# Patient Record
Sex: Male | Born: 1970 | Race: White | Hispanic: No | Smoking: Current every day smoker
Health system: Southern US, Community
[De-identification: ages and names within clinical notes are randomized; demographics above are authoritative.]

## PROBLEM LIST (undated history)

## (undated) DIAGNOSIS — I10 Essential (primary) hypertension: Secondary | ICD-10-CM

## (undated) HISTORY — PX: HERNIA REPAIR: SHX51

## (undated) HISTORY — PX: OTHER SURGICAL HISTORY: SHX169

---
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
## 1997-08-11 ENCOUNTER — Ambulatory Visit (HOSPITAL_COMMUNITY): Admission: RE | Admit: 1997-08-11 | Discharge: 1997-08-11 | Payer: Self-pay | Admitting: Physician Assistant

## 1998-05-01 ENCOUNTER — Emergency Department (HOSPITAL_COMMUNITY): Admission: EM | Admit: 1998-05-01 | Discharge: 1998-05-01 | Payer: Self-pay | Admitting: Emergency Medicine

## 1998-05-01 ENCOUNTER — Encounter: Payer: Self-pay | Admitting: Emergency Medicine

## 2002-04-20 HISTORY — PX: ANTERIOR CERVICAL DISCECTOMY: SHX1160

## 2004-02-26 ENCOUNTER — Ambulatory Visit (HOSPITAL_COMMUNITY): Admission: RE | Admit: 2004-02-26 | Discharge: 2004-02-27 | Payer: Self-pay | Admitting: Neurological Surgery

## 2009-07-25 ENCOUNTER — Emergency Department (HOSPITAL_COMMUNITY): Admission: EM | Admit: 2009-07-25 | Discharge: 2009-07-25 | Payer: Self-pay | Admitting: Emergency Medicine

## 2009-07-31 ENCOUNTER — Encounter
Admission: RE | Admit: 2009-07-31 | Discharge: 2009-07-31 | Payer: Self-pay | Source: Home / Self Care | Admitting: Surgery

## 2010-05-05 LAB — SURGICAL PCR SCREEN
MRSA, PCR: NEGATIVE
Staphylococcus aureus: POSITIVE — AB

## 2010-05-05 LAB — CBC
HCT: 41.5 % (ref 39.0–52.0)
Hemoglobin: 14.9 g/dL (ref 13.0–17.0)
MCH: 33.4 pg (ref 26.0–34.0)
MCHC: 35.9 g/dL (ref 30.0–36.0)
MCV: 93 fL (ref 78.0–100.0)
Platelets: 209 10*3/uL (ref 150–400)
RBC: 4.46 MIL/uL (ref 4.22–5.81)
RDW: 13.6 % (ref 11.5–15.5)
WBC: 8.1 10*3/uL (ref 4.0–10.5)

## 2010-05-06 ENCOUNTER — Ambulatory Visit (HOSPITAL_COMMUNITY)
Admission: RE | Admit: 2010-05-06 | Discharge: 2010-05-06 | Payer: Self-pay | Source: Home / Self Care | Attending: Surgery | Admitting: Surgery

## 2010-05-19 NOTE — Op Note (Signed)
NAMEGEROLD, Paul Galloway               ACCOUNT NO.:  [id number]  MEDICAL RECORD NO.:  [id number]          PATIENT TYPE:  AMB  LOCATION:  SDS                          FACILITY:  [facility]  PHYSICIAN:  Paul Sportsman, MD     DATE OF BIRTH:  [birth year]  DATE OF PROCEDURE:  05/06/2010 DATE OF DISCHARGE:  05/06/2010                              OPERATIVE REPORT   PRIMARY CARE PHYSICIAN:  Paul Galloway. Paul Miyamoto, MD  UROLOGIST:  Paul Millard. Dahlstedt, MD  OPERATING SURGEON:  Paul Sportsman, MD  ASSISTANT:  RN.  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  POSTOPERATIVE DIAGNOSES: 1. Supraumbilical hernia. 2. Umbilical hernia. 3. Diastasis recti.  PROCEDURE PERFORMED:  Laparoscopic ventral hernia repairs with mesh.  ANESTHESIA: 1. General anesthesia. 2. Local anesthetic and a field block around all fascial stitch sites     and port sites.  SPECIMENS:  None.  DRAINS:  None.  ESTIMATED BLOOD LOSS:  Minimal.  COMPLICATIONS:  None apparent.  INDICATIONS:  Mr. Paul Galloway is a 40 year old very physically active male who does a lot of heavy lifting and is intensive in martial arts.  He developed inguinal hernias that I had laparoscopically repaired over a year ago.  He has recovered well from that but felt a pop above his bellybutton and pain and discomfort there.  He certainly has some central abdominal discomfort from time to time.  On exam, I felt he had a definite supraumbilical ventricular hernia, but it was reducible.  He has diastasis down to his umbilicus as well.  Anatomy and embryology of abdominal formation was discussed, pathophysiology of herniation with its natural history and risks of increasing size, incarceration, strangulation were discussed.  Options were discussed.  Recommendations were made for a repair.  Open primary versus with or without mesh and laparoscopic repair was discussed. Given the fact he is a smoker and has a  lot of heavy physical activity, I recommended repair with mesh.  Given the central diastasis, I have recommended diagnostic laparoscopy to get a better lifeline and plan out repair.  Risks, benefits, and alternatives were discussed.  Questions were answered and he agreed to proceed.  OPERATIVE FINDINGS:  He had a few switched hernias from the supraumbilical region to the umbilicus in an area of diastasis. Supraumbilical site largest was around 2 x 2 cm and periumbilical region was 1 x 1.5 cm.  Total region was about 9 x 2.5 cm.  DESCRIPTION OF PROCEDURE:  Informed consent was confirmed.  The patient underwent general anesthesia without any difficulty.  He voided prior entering the operating room.  He received IV antibiotics just prior to incision.  He had sequential compression devices active during the entire case.  He was positioned supine, both arms tucked.  His abdomen was clipped, prepped, and draped in sterile fashion.  Surgical time-out confirmed our plan.  I placed a 5-mm port in the left upper quadrant using optical entry technique right after the subcostal ridge with the patient in steep reverse Trendelenburg and left side up.  Entry was clean.  I introduced carbon dioxide for insufflation.  Camera  inspection revealed no injury. I could see any obvious umbilical hernia.  I can see some dimpling within the falciform ligament.  I placed 5-0 ports in the left flank in the left lower quadrant.  I took down the falciform ligament off the anterior abdominal wall and excised it just off its exit point at the liver.  With that, I could see the supraumbilical hernia where redundant falciform ligament had been going into that area.  Following from that region down which was about between the xiphoid and the umbilicus going down from that supraumbilical ventricular hernia down to the umbilicus, he had obvious diastasis, he had a few switched hernias around his umbilicus as  well.  Given his heavy physical activity and size and smoking, I felt that he would benefit from a larger piece of mesh.  Initially anticipated a patch to hold region including the diastasis.  I therefore chose a 15 x 20 cm dual-sided Parietex/Seprafilm mesh.  I placed in the anterior abdominal wall after dilating one of the fascia sites to place the port through.  I secured the anterior abdominal wall using 16 #1 Prolene stitches using the laparoscopic suture passer.  This provided over 5 cm coverage around all defects.  Mesh laid well.  I decompressed carbon dioxide and tied all stitch down.  I re- insufflated and noted the mesh lying nice and flat, was snugged and had overly tight and no tension.  I used an observable tacker to tack the rims of the mesh and central part of the mesh.  His abdominal wall was thin enough that I felt these tacks were virtually transfascial as well. Hemostasis was good.  I evacuated carbon dioxide.  I removed all ports. The left upper quadrant port site was the one I gently dilated the fascia.  I can barely get my pinky through it, closed it with some 0 Vicryl interrupted stitches x2.  I closed the skin using 4-0 Monocryl stitch at the port sites and Steri-Strips on the small puncture sites.  The patient is being extubated and took to recovery room in stable condition.  I discussed postop care with the patient in detail in the office and just holding area as well.  I discussed with his significant other as well.     Paul Sportsman, MD     SCG/MEDQ  D:  05/06/2010  T:  05/06/2010  Job:  [id number]  cc:   Paul Galloway. Paul Galloway, M.D. Paul Millard. Galloway, M.D.  Electronically Signed by Paul Soda MD on 05/19/2010 12:29:06 PM

## 2010-07-09 LAB — POCT I-STAT, CHEM 8
BUN: 22 mg/dL (ref 6–23)
Calcium, Ion: 1.12 mmol/L (ref 1.12–1.32)
Chloride: 107 mEq/L (ref 96–112)
Creatinine, Ser: 1.1 mg/dL (ref 0.4–1.5)
Glucose, Bld: 95 mg/dL (ref 70–99)
HCT: 51 % (ref 39.0–52.0)
Hemoglobin: 17.3 g/dL — ABNORMAL HIGH (ref 13.0–17.0)
Potassium: 3.6 mEq/L (ref 3.5–5.1)
Sodium: 140 mEq/L (ref 135–145)
TCO2: 24 mmol/L (ref 0–100)

## 2010-07-09 LAB — CK: Total CK: 217 U/L (ref 7–232)

## 2010-09-05 NOTE — Op Note (Signed)
Paul Galloway, Paul Galloway               ACCOUNT NO.:  [id number]   MEDICAL RECORD NO.:  [id number]          PATIENT TYPE:  OIB   LOCATION:  2899                         FACILITY:  [facility]   PHYSICIAN:  Stefani Dama, M.D.  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   OPERATION PERFORMED:  Anterior cervical decompression, arthrodesis C5-C6  structural allograft Alphatec fixation.   SURGEON:  Stefani Dama, M.D.   ASSISTANT:  Coletta Memos, M.D.   ANESTHESIA:  General endotracheal.   INDICATIONS FOR PROCEDURE:  The patient is a 40 year old individual who has  had significant neck, shoulder and right arm pain.  He has had some  expression of weakness there.  He has evidence of a Lhermitte's phenomenon  also.  He has been advised regarding surgical decompression and  stabilization via an anterior procedure.   DESCRIPTION OF PROCEDURE:  The patient was brought to the operating room and  placed on the table in supine position.  After smooth induction of general  endotracheal anesthesia, he was placed in five pounds of halter traction.  Neck was shaved, prepped with DuraPrep and draped in a sterile fashion.  Transverse incision was created in the neck and carried down through the  platysma.  The plane between the sternocleidomastoid muscle and the strap  muscles was dissected bluntly until the prevertebral space was reached.  The  first identifiable disk space was noted to be that of C5 and C6 on a  localizing radiograph.  The longus colli muscle was then stripped back  slightly and a self-retaining retractor was placed in the wound.  The disk  space was opened with a #15 blade and then a combination of curettes and  rongeurs was used to remove a significant amount of severely degenerated  disk material.  A ventral osteophyte was taken down with the Leksell rongeur  and the dissection was carried out to either side to decompress the disk  space fully.  The posterior longitudinal ligament was reached and on the  right side the ligament was noted to have a hole and by exploring this hole,  several fragments of disk material from the epidural space were removed.  This allowed for good decompression of the common dural tube right at the  take off of the C6 nerve root on the right side.  Further exploration  yielded the posterior edge of the posterior longitudinal ligament.  This was  taken up to expose the common dural tube and a C6 nerve root.  Dissection  was carried out to the left side and a similar decompression was undertaken  here.  Hemostasis from the soft tissues was obtained with a bipolar cautery  and some small pledgets of Gelfoam soaked in Thrombin which were later  removed.  A 7 mm Tranzgraft was then shaped to the appropriate size and  dimension, filled  with the patient's own autograft from dissecting the  uncinate spurs and DBX was used to fill in the remainder.  The bone graft  was sized and fitted appropriately into the disk space an then a standard  sized 18 mm Alphatec plate was fixed with six screws in C6 and variable  angle screws measuring 14 mm in length at C5.  Hemostasis was checked in the  soft tissues and the wound was copiously irrigated with antibiotic  irrigating solution after a localizing radiograph was obtained.  The locking  apparatus was  applied.  Then the patient's wound was closed with 3-0 Vicryl in the  platysma and 3-0 Vicryl in the subcuticular tissues.  Dermabond was placed  on the skin.  The patient tolerated the procedure well and was returned to  recovery room in stable condition.       HJE/MEDQ  D:  02/26/2004  T:  02/26/2004  Job:  [id number]

## 2013-01-31 ENCOUNTER — Emergency Department (HOSPITAL_COMMUNITY)
Admission: EM | Admit: 2013-01-31 | Discharge: 2013-01-31 | Disposition: A | Discharge status: Home / Self Care | Payer: Self-pay | Attending: Emergency Medicine | Admitting: Emergency Medicine

## 2013-01-31 ENCOUNTER — Encounter (HOSPITAL_COMMUNITY): Payer: Self-pay | Admitting: Emergency Medicine

## 2013-01-31 DIAGNOSIS — L03113 Cellulitis of right upper limb: Secondary | ICD-10-CM

## 2013-01-31 DIAGNOSIS — W268XXA Contact with other sharp object(s), not elsewhere classified, initial encounter: Secondary | ICD-10-CM | POA: Insufficient documentation

## 2013-01-31 DIAGNOSIS — F17200 Nicotine dependence, unspecified, uncomplicated: Secondary | ICD-10-CM | POA: Insufficient documentation

## 2013-01-31 DIAGNOSIS — Z881 Allergy status to other antibiotic agents status: Secondary | ICD-10-CM | POA: Insufficient documentation

## 2013-01-31 DIAGNOSIS — Z888 Allergy status to other drugs, medicaments and biological substances status: Secondary | ICD-10-CM | POA: Insufficient documentation

## 2013-01-31 DIAGNOSIS — Y939 Activity, unspecified: Secondary | ICD-10-CM | POA: Insufficient documentation

## 2013-01-31 DIAGNOSIS — Y929 Unspecified place or not applicable: Secondary | ICD-10-CM | POA: Insufficient documentation

## 2013-01-31 DIAGNOSIS — Z23 Encounter for immunization: Secondary | ICD-10-CM | POA: Insufficient documentation

## 2013-01-31 DIAGNOSIS — L02519 Cutaneous abscess of unspecified hand: Secondary | ICD-10-CM | POA: Insufficient documentation

## 2013-01-31 LAB — BASIC METABOLIC PANEL
BUN: 13 mg/dL (ref 6–23)
CO2: 26 mEq/L (ref 19–32)
Calcium: 9.1 mg/dL (ref 8.4–10.5)
Chloride: 101 mEq/L (ref 96–112)
Creatinine, Ser: 0.75 mg/dL (ref 0.50–1.35)
GFR calc Af Amer: >90 mL/min (ref >90)
GFR calc non Af Amer: >90 mL/min (ref >90)
Glucose, Bld: 140 mg/dL — ABNORMAL HIGH (ref 70–99)
Potassium: 3.5 mEq/L (ref 3.5–5.1)
Sodium: 137 mEq/L (ref 135–145)

## 2013-01-31 LAB — CBC WITH DIFFERENTIAL/PLATELET
Basophils Absolute: 0 10*3/uL (ref 0.0–0.1)
Basophils Relative: 0 % (ref 0–1)
Eosinophils Absolute: 0.2 10*3/uL (ref 0.0–0.7)
Eosinophils Relative: 3 % (ref 0–5)
HCT: 43.8 % (ref 39.0–52.0)
Hemoglobin: 15.3 g/dL (ref 13.0–17.0)
Lymphocytes Relative: 18 % (ref 12–46)
Lymphs Abs: 1.4 10*3/uL (ref 0.7–4.0)
MCH: 33.6 pg (ref 26.0–34.0)
MCHC: 34.9 g/dL (ref 30.0–36.0)
MCV: 96.1 fL (ref 78.0–100.0)
Monocytes Absolute: 0.7 10*3/uL (ref 0.1–1.0)
Monocytes Relative: 10 % (ref 3–12)
Neutro Abs: 5.3 10*3/uL (ref 1.7–7.7)
Neutrophils Relative %: 69 % (ref 43–77)
Platelets: 186 10*3/uL (ref 150–400)
RBC: 4.56 MIL/uL (ref 4.22–5.81)
RDW: 13.7 % (ref 11.5–15.5)
WBC: 7.9 10*3/uL (ref 4.0–10.5)

## 2013-01-31 MED ORDER — TETANUS-DIPHTH-ACELL PERTUSSIS 5-2.5-18.5 LF-MCG/0.5 IM SUSP
0.5000 mL | Freq: Once | INTRAMUSCULAR | Status: AC
  Administered 2013-01-31: 0.5 mL via INTRAMUSCULAR
  Filled 2013-01-31: qty 0.5

## 2013-01-31 MED ORDER — CLINDAMYCIN HCL 150 MG PO CAPS: 300.0000 mg | ORAL_CAPSULE | Freq: Three times a day (TID) | ORAL | Status: DC

## 2013-01-31 MED ORDER — HYDROMORPHONE HCL PF 1 MG/ML IJ SOLN
1.0000 mg | Freq: Once | INTRAMUSCULAR | Status: AC
  Administered 2013-01-31: 1 mg via INTRAVENOUS
  Filled 2013-01-31: qty 1

## 2013-01-31 MED ORDER — SODIUM CHLORIDE 0.9 % IV BOLUS (SEPSIS)
1000.0000 mL | Freq: Once | INTRAVENOUS | Status: AC
  Administered 2013-01-31: 1000 mL via INTRAVENOUS

## 2013-01-31 MED ORDER — HYDROCODONE-ACETAMINOPHEN 5-325 MG PO TABS: 2.0000 | ORAL_TABLET | ORAL | Status: DC | PRN

## 2013-01-31 MED ORDER — CLINDAMYCIN PHOSPHATE 900 MG/50ML IV SOLN
900.0000 mg | Freq: Once | INTRAVENOUS | Status: AC
  Administered 2013-01-31: 900 mg via INTRAVENOUS
  Filled 2013-01-31: qty 50

## 2013-01-31 MED ORDER — MORPHINE SULFATE 4 MG/ML IJ SOLN
4.0000 mg | Freq: Once | INTRAMUSCULAR | Status: AC
  Administered 2013-01-31: 4 mg via INTRAVENOUS
  Filled 2013-01-31: qty 1

## 2013-01-31 MED ORDER — ONDANSETRON HCL 4 MG/2ML IJ SOLN
4.0000 mg | Freq: Once | INTRAMUSCULAR | Status: AC
  Administered 2013-01-31: 4 mg via INTRAVENOUS
  Filled 2013-01-31: qty 2

## 2013-01-31 NOTE — ED Notes (Signed)
Pt states he had a closed wound on the posterior part of his left hand, pt reports that he bumped his hand on chiscel yesterday. Pt states the wound opened and within 12 hours his hand swelled up greatly. Area is red and warm to the touch. Pt states he has full sensation below the wound. Radial pulse is 2+.

## 2013-01-31 NOTE — ED Notes (Signed)
Pt states that his hand is throbbing given pain meds and ginger ale hand is propped up for comfort

## 2013-01-31 NOTE — ED Notes (Signed)
PA at bedside.

## 2013-01-31 NOTE — ED Provider Notes (Signed)
CSN: [id number]     Arrival date & time 01/31/13  6295 History   First MD Initiated Contact with Patient 01/31/13 0630     Chief Complaint  Patient presents with  . Cellulitis  . Hand Injury   (Consider location/radiation/quality/duration/timing/severity/associated sxs/prior Treatment) HPI Comments: Patient is a 42 year old male with no significant past medical history who presents with a 2 day history of left hand pain. Symptoms started gradually and progressively worsened since the onset after he was "poked" with a serrated chisel. The pain is throbbing and severe without radiation. Patient has soaked his hand in warm salt water and cleaned the area. Palpation of the area and left hand movement makes the pain worse. No alleviating factors. No other associated symptoms.   Patient is a 42 y.o. male presenting with hand injury.  Hand Injury   History reviewed. No pertinent past medical history. Past Surgical History  Procedure Laterality Date  . Hernia repair    . Right hand surgery    . Knee sugery     No family history on file. History  Substance Use Topics  . Smoking status: Current Every Day Smoker -- 1.00 packs/day  . Smokeless tobacco: Not on file  . Alcohol Use: Yes    Review of Systems  Musculoskeletal: Positive for joint swelling.  Skin: Positive for color change and wound.  All other systems reviewed and are negative.    Allergies  Amoxicillin and Tramadol  Home Medications  No current outpatient prescriptions on file. BP 142/105  Pulse 98  Temp(Src) 99.3 F (37.4 C) (Oral)  Resp 18  SpO2 98% Physical Exam  Nursing note and vitals reviewed. Constitutional: He is oriented to person, place, and time. He appears well-developed and well-nourished. No distress.  HENT:  Head: Normocephalic and atraumatic.  Eyes: Conjunctivae are normal.  Neck: Normal range of motion.  Cardiovascular: Normal rate, regular rhythm and intact distal pulses.  Exam reveals no  gallop and no friction rub.   No murmur heard. Pulmonary/Chest: Effort normal and breath sounds normal. He has no wheezes. He has no rales. He exhibits no tenderness.  Abdominal: Soft. There is no tenderness.  Musculoskeletal: Normal range of motion.  Left hand fingers ROM limited due to pain.   Neurological: He is alert and oriented to person, place, and time. Coordination normal.  Speech is goal-oriented. Moves limbs without ataxia.   Skin: Skin is warm and dry.  Localized induration and some central fluctuance noted to dorsum of left hand. Some clear drainage from the small, central open area. The area is tender to palpation and warm with associated erythema.   Psychiatric: He has a normal mood and affect. His behavior is normal.    ED Course  Procedures (including critical care time)  INCISION AND DRAINAGE Performed by: Emilia Beck Consent: Verbal consent obtained. Risks and benefits: risks, benefits and alternatives were discussed Type: abscess  Body area: dorsal left hand  Anesthesia: local infiltration  Incision was made with a scalpel.  Local anesthetic: lidocaine 2% without epinephrine  Anesthetic total: 2 ml  Complexity: complex Blunt dissection to break up loculations  Drainage: serosanguinous  Drainage amount: 3mL  Packing material: none  Patient tolerance: Patient tolerated the procedure well with no immediate complications.     Labs Review Labs Reviewed  BASIC METABOLIC PANEL - Abnormal; Notable for the following:    Glucose, Bld 140 (*)    All other components within normal limits  CBC WITH DIFFERENTIAL  Imaging Review No results found.  EKG Interpretation   None       MDM   1. Cellulitis of hand, right     7:03 AM Incision and drainage did not produce much drainage. Patient will have basic labs and IV Clindamycin with morphine and zofran. Vitals stable and patient afebrile.   10:40 AM Patient feeling better after additional  pain medication dose and IV Clindamycin. Vitals stable and patient afebrile. Patient will be discharged with Clindamycin and Vicodin for pain. Patient instructed to return with worsening or concerning symptoms. No evidence of spreading infection on physical exam and no evidence of disseminated infection based on labs. Patient instructed to return with worsening or concerning symptoms.    Emilia Beck, PA-C 01/31/13 1051

## 2013-02-03 NOTE — ED Provider Notes (Signed)
Medical screening examination/treatment/procedure(s) were performed by non-physician practitioner and as supervising physician I was immediately available for consultation/collaboration.   Brandt Loosen, MD 02/03/13 1339

## 2014-11-05 ENCOUNTER — Emergency Department (HOSPITAL_COMMUNITY): Payer: Self-pay

## 2014-11-05 ENCOUNTER — Encounter (HOSPITAL_COMMUNITY): Payer: Self-pay | Admitting: Emergency Medicine

## 2014-11-05 ENCOUNTER — Observation Stay (HOSPITAL_COMMUNITY)
Admission: EM | Admit: 2014-11-05 | Discharge: 2014-11-06 | Disposition: A | Discharge status: Home / Self Care | Payer: Self-pay | Attending: Internal Medicine | Admitting: Internal Medicine

## 2014-11-05 DIAGNOSIS — R079 Chest pain, unspecified: Secondary | ICD-10-CM | POA: Insufficient documentation

## 2014-11-05 DIAGNOSIS — E876 Hypokalemia: Secondary | ICD-10-CM | POA: Insufficient documentation

## 2014-11-05 DIAGNOSIS — F17210 Nicotine dependence, cigarettes, uncomplicated: Secondary | ICD-10-CM | POA: Insufficient documentation

## 2014-11-05 DIAGNOSIS — Z888 Allergy status to other drugs, medicaments and biological substances status: Secondary | ICD-10-CM | POA: Insufficient documentation

## 2014-11-05 DIAGNOSIS — R0602 Shortness of breath: Secondary | ICD-10-CM | POA: Diagnosis present

## 2014-11-05 DIAGNOSIS — F1210 Cannabis abuse, uncomplicated: Secondary | ICD-10-CM | POA: Insufficient documentation

## 2014-11-05 DIAGNOSIS — I10 Essential (primary) hypertension: Principal | ICD-10-CM | POA: Diagnosis present

## 2014-11-05 DIAGNOSIS — Z7982 Long term (current) use of aspirin: Secondary | ICD-10-CM | POA: Insufficient documentation

## 2014-11-05 DIAGNOSIS — Z881 Allergy status to other antibiotic agents status: Secondary | ICD-10-CM | POA: Insufficient documentation

## 2014-11-05 LAB — CBC
HCT: 43.3 % (ref 39.0–52.0)
Hemoglobin: 14.9 g/dL (ref 13.0–17.0)
MCH: 33.3 pg (ref 26.0–34.0)
MCHC: 34.4 g/dL (ref 30.0–36.0)
MCV: 96.9 fL (ref 78.0–100.0)
Platelets: 224 10*3/uL (ref 150–400)
RBC: 4.47 MIL/uL (ref 4.22–5.81)
RDW: 14.3 % (ref 11.5–15.5)
WBC: 7.6 10*3/uL (ref 4.0–10.5)

## 2014-11-05 LAB — BASIC METABOLIC PANEL
Anion gap: 8 (ref 5–15)
BUN: 14 mg/dL (ref 6–20)
CO2: 28 mmol/L (ref 22–32)
Calcium: 8.9 mg/dL (ref 8.9–10.3)
Chloride: 104 mmol/L (ref 101–111)
Creatinine, Ser: 0.73 mg/dL (ref 0.61–1.24)
GFR calc Af Amer: >60 mL/min (ref >60)
GFR calc non Af Amer: >60 mL/min (ref >60)
Glucose, Bld: 117 mg/dL — ABNORMAL HIGH (ref 65–99)
Potassium: 3.2 mmol/L — ABNORMAL LOW (ref 3.5–5.1)
Sodium: 140 mmol/L (ref 135–145)

## 2014-11-05 LAB — BRAIN NATRIURETIC PEPTIDE: B Natriuretic Peptide: 95.7 pg/mL (ref 0.0–100.0)

## 2014-11-05 LAB — I-STAT TROPONIN, ED: Troponin i, poc: 0.01 ng/mL (ref 0.00–0.08)

## 2014-11-05 MED ORDER — ONDANSETRON HCL 4 MG PO TABS: 4.0000 mg | ORAL_TABLET | Freq: Four times a day (QID) | ORAL | Status: DC | PRN

## 2014-11-05 MED ORDER — NICOTINE 14 MG/24HR TD PT24
14.0000 mg | MEDICATED_PATCH | Freq: Every day | TRANSDERMAL | Status: DC
  Administered 2014-11-06 (×2): 14 mg via TRANSDERMAL
  Filled 2014-11-05 (×2): qty 1

## 2014-11-05 MED ORDER — ONDANSETRON HCL 4 MG/2ML IJ SOLN: 4.0000 mg | Freq: Four times a day (QID) | INTRAMUSCULAR | Status: DC | PRN

## 2014-11-05 MED ORDER — METOPROLOL TARTRATE 1 MG/ML IV SOLN
5.0000 mg | Freq: Four times a day (QID) | INTRAVENOUS | Status: DC | PRN
Start: 2014-11-05 — End: 2014-11-06
  Administered 2014-11-06: 5 mg via INTRAVENOUS
  Filled 2014-11-05: qty 5

## 2014-11-05 MED ORDER — POTASSIUM CHLORIDE CRYS ER 20 MEQ PO TBCR
40.0000 meq | EXTENDED_RELEASE_TABLET | Freq: Once | ORAL | Status: AC
  Administered 2014-11-06: 40 meq via ORAL
  Filled 2014-11-05: qty 2

## 2014-11-05 MED ORDER — ACETAMINOPHEN 325 MG PO TABS: 650.0000 mg | ORAL_TABLET | Freq: Four times a day (QID) | ORAL | Status: DC | PRN

## 2014-11-05 MED ORDER — ASPIRIN 81 MG PO CHEW
324.0000 mg | CHEWABLE_TABLET | Freq: Once | ORAL | Status: AC
  Administered 2014-11-05: 324 mg via ORAL
  Filled 2014-11-05: qty 4

## 2014-11-05 MED ORDER — ACETAMINOPHEN 650 MG RE SUPP: 650.0000 mg | Freq: Four times a day (QID) | RECTAL | Status: DC | PRN

## 2014-11-05 MED ORDER — METOPROLOL TARTRATE 25 MG PO TABS
25.0000 mg | ORAL_TABLET | Freq: Two times a day (BID) | ORAL | Status: DC
  Administered 2014-11-06 (×2): 25 mg via ORAL
  Filled 2014-11-05 (×2): qty 1

## 2014-11-05 MED ORDER — IPRATROPIUM-ALBUTEROL 0.5-2.5 (3) MG/3ML IN SOLN: 3.0000 mL | Freq: Four times a day (QID) | RESPIRATORY_TRACT | Status: DC | PRN

## 2014-11-05 MED ORDER — SODIUM CHLORIDE 0.9 % IJ SOLN
3.0000 mL | Freq: Two times a day (BID) | INTRAMUSCULAR | Status: DC
  Administered 2014-11-06 (×2): 3 mL via INTRAVENOUS

## 2014-11-05 MED ORDER — HEPARIN SODIUM (PORCINE) 5000 UNIT/ML IJ SOLN
5000.0000 [IU] | Freq: Three times a day (TID) | INTRAMUSCULAR | Status: DC
  Administered 2014-11-06: 5000 [IU] via SUBCUTANEOUS
  Filled 2014-11-05: qty 1

## 2014-11-05 MED ORDER — IOHEXOL 350 MG/ML SOLN
100.0000 mL | Freq: Once | INTRAVENOUS | Status: AC | PRN
  Administered 2014-11-05: 100 mL via INTRAVENOUS

## 2014-11-05 MED ORDER — METOPROLOL TARTRATE 1 MG/ML IV SOLN
5.0000 mg | Freq: Once | INTRAVENOUS | Status: AC
  Administered 2014-11-05: 5 mg via INTRAVENOUS
  Filled 2014-11-05: qty 5

## 2014-11-05 NOTE — ED Notes (Signed)
Pt ambulated to restroom without assistance per request.

## 2014-11-05 NOTE — ED Notes (Signed)
Pt c/o shob that started while he was driving down the road. Pt states that he is feeling little better now but is very nervous.

## 2014-11-05 NOTE — ED Provider Notes (Signed)
CSN: [id number]     Arrival date & time 11/05/14  1844 History   First MD Initiated Contact with Patient 11/05/14 1917     Chief Complaint  Patient presents with  . Shortness of Breath     (Consider location/radiation/quality/duration/timing/severity/associated sxs/prior Treatment) HPI Comments: The patient is a 44 year old male, he has a history of no significant medical problems, he denies any history of taking medications, though he does drink alcohol and smoke he denies any cocaine use. He states that approximately 2 hours ago he started to get short of breath while he was driving. This was acute in onset, it was significant, it was associated with some chest pain. It is persistent but gradually improving. He denies any swelling of the lower extremities, denies any back pain, neck pain, blurred vision, nausea vomiting or diarrhea. 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 He has no history of blood clots, no history of heart disease, he states that over the last several months he has been having intermittent spells of severe tachycardia where he feels like his heart is beating very fast, this lasts a short amount of time and then resolve spontaneously.  Patient is a 44 y.o. male presenting with shortness of breath. The history is provided by the patient.  Shortness of Breath   History reviewed. No pertinent past medical history. Past Surgical History  Procedure Laterality Date  . Hernia repair    . Right hand surgery    . Knee sugery     No family history on file. History  Substance Use Topics  . Smoking status: Current Every Day Smoker -- 1.00 packs/day    Types: Cigarettes  . Smokeless tobacco: Not on file  . Alcohol Use: Yes    Review of Systems  Respiratory: Positive for shortness of breath.   All other systems reviewed and are  negative.     Allergies  Amoxicillin; Erythromycin; and Tramadol  Home Medications   Prior to Admission medications   Medication Sig Start Date End Date Taking? Authorizing Provider  Aspirin-Salicylamide-Caffeine (BC HEADACHE POWDER PO) Take 1 packet by mouth 2 (two) times daily as needed (pain).   Yes Historical Provider, MD  NON FORMULARY Take 1 scoop by mouth every morning. Nitro-flex   Yes Historical Provider, MD   BP 157/92 mmHg  Pulse 93  Temp(Src) 98.2 F (36.8 C) (Oral)  Resp 23  Ht 5\' 11"  (1.803 m)  Wt 200 lb (90.719 kg)  BMI 27.91 kg/m2  SpO2 100% Physical Exam  Constitutional: He appears well-developed and well-nourished. No distress.  HENT:  Head: Normocephalic and atraumatic.  Mouth/Throat: Oropharynx is clear and moist. No oropharyngeal exudate.  Eyes: Conjunctivae and EOM are normal. Pupils are equal, round, and reactive to light. Right eye exhibits no discharge. Left eye exhibits no discharge. No scleral icterus.  Neck: Normal range of motion. Neck supple. No JVD present. No thyromegaly present.  Cardiovascular: Regular rhythm, normal heart sounds and intact distal pulses.  Exam reveals no gallop and no friction rub.   No murmur heard. Sinus tachycardia with frequent ectopy, pulse of 115 bpm. No JVD, no peripheral edema  Pulmonary/Chest: Effort normal and breath sounds normal. No respiratory distress. He has no wheezes. He has no rales.  Abdominal: Soft. Bowel sounds are normal. He exhibits no distension and no mass. There is no tenderness.  Musculoskeletal: Normal range  of motion. He exhibits no edema or tenderness.  Lymphadenopathy:    He has no cervical adenopathy.  Neurological: He is alert. Coordination normal.  Skin: Skin is warm and dry. No rash noted. No erythema.  Psychiatric: He has a normal mood and affect. His behavior is normal.  Nursing note and vitals reviewed.   ED Course  Procedures (including critical care time) Labs Review Labs  Reviewed  BASIC METABOLIC PANEL - Abnormal; Notable for the following:    Potassium 3.2 (*)    Glucose, Bld 117 (*)    All other components within normal limits  CBC  BRAIN NATRIURETIC PEPTIDE  I-STAT TROPOININ, ED    Imaging Review Dg Chest 2 View  11/05/2014   CLINICAL DATA:  Shortness of breath and chest pain beginning last night with hypertension. Headache.  EXAM: CHEST  2 VIEW  COMPARISON:  None.  FINDINGS: Lungs are adequately inflated without focal consolidation or effusion. Cardiomediastinal silhouette is within normal. There are minimal degenerative changes of the spine.  IMPRESSION: No active cardiopulmonary disease.   Electronically Signed   By: Elberta Fortis M.D.   On: 11/05/2014 19:53   Ct Angio Chest Pe W/cm &/or Wo Cm  11/05/2014   CLINICAL DATA:  Sudden onset of shortness of breath last night, chest tightness, elevated blood pressure, smoker  EXAM: CT ANGIOGRAPHY CHEST WITH CONTRAST  TECHNIQUE: Multidetector CT imaging of the chest was performed using the standard protocol during bolus administration of intravenous contrast. Multiplanar CT image reconstructions and MIPs were obtained to evaluate the vascular anatomy.  CONTRAST:  OMNIPAQUE IOHEXOL 350 MG/ML SOLN IV  COMPARISON:  None  FINDINGS: Aorta normal caliber without aneurysm or dissection.  Borderline enlarged RIGHT paratracheal node 10 mm short axis image 31.  Upper normal sized 9 mm paraesophageal node image 66.  No additional thoracic adenopathy.  Visualized upper abdomen unremarkable.  Pulmonary arteries well opacified and patent.  No evidence of pulmonary embolism.  Calcified granuloma RIGHT upper lobe.  Mild central peribronchial thickening.  No pulmonary infiltrate, pleural effusion, pneumothorax or additional pulmonary mass/ nodule.  Questionable wall thickening of the distal esophagus.  No acute osseous findings.  Review of the MIP images confirms the above findings.  IMPRESSION: No evidence of pulmonary embolism.   Borderline enlarged RIGHT peritracheal in upper normal sized paraesophageal lymph nodes.  Central peribronchial thickening with old granulomatous disease.  Questionable wall thickening distal esophagus, unable to exclude esophagitis or reflux disease.   Electronically Signed   By: Ulyses Southward M.D.   On: 11/05/2014 21:21     EKG Interpretation   Date/Time:  Monday November 05 2014 18:51:20 EDT Ventricular Rate:  116 PR Interval:  151 QRS Duration: 94 QT Interval:  331 QTC Calculation: 460 R Axis:   103 Text Interpretation:  Sinus tachycardia Ventricular trigeminy Consider  left ventricular hypertrophy Repol abnrm suggests ischemia, inferior leads  ST abnormalities diffusely Abnormal ekg No old tracing to compare  Confirmed by Tyheim Vanalstyne  MD, Erick Murin ([id number]) on 11/05/2014 7:18:59 PM      MDM   Final diagnoses:  SOB (shortness of breath)  Essential hypertension  Chest pain, unspecified chest pain type    The patient has severe hypertension, very abnormal EKG, chest pain or shortness of breath raises concern for ischemia as well as pulmonary embolism, will obtain a CT angiogram, labs including troponin and BNP, treat blood pressure with metoprolol at this time. The patient is in agreement with the plan.   The patient's  blood pressure has improved significantly with medications, he is no longer tachycardic, CT scan of the chest reveals no pulmonary embolism, there are some enlarged lymph nodes but no signs of pulmonary edema. Troponin is negative, EKG is grossly abnormal.  I discussed with the hospitalist who will come to see the patient for admission.  I have personally viewed and interpreted the imaging and agree with radiologist interpretation.   Eber Hong, MD 11/05/14 2134

## 2014-11-05 NOTE — ED Notes (Signed)
Patient transported to X-ray 

## 2014-11-05 NOTE — H&P (Signed)
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 He states he felt as tough he was suffocating. He denies being lightheaded or dizzy. He denies chest pains but states he has had chest discomfort in the recent past. He states that he has had palpitations in the past but none today. He felt so badly that he has someone followed him as he drove to the ER this evening. He does smoke, uses occasional pot and binge drinks (rum) on the weekends. He denies any withdrawal symptoms from his alcohol use. In the ER his blood pressure was 220/114. He denies any prior history of hypertension. He denies any headache or any localized symptoms of weakness or confusion. He does not have a PCP.   Review of Systems:  The patient denies anorexia, fever, weight loss, vision loss, decreased hearing, hoarseness, chest pain, syncope, SOB dyspnea on exertion, peripheral edema, balance deficits, hemoptysis, abdominal pain, melena, hematochezia, severe indigestion/heartburn, hematuria, incontinence, genital sores, muscle weakness, suspicious skin lesions, transient blindness, difficulty walking, depression, unusual weight change, abnormal bleeding, enlarged lymph nodes, angioedema, and breast masses.  Past Medical History: History reviewed. No pertinent past medical history. Past Surgical History  Procedure Laterality Date  . Hernia repair    . Right hand surgery    . Knee sugery      Medications: Prior to Admission medications   Medication Sig Start Date End Date Taking? Authorizing Provider  Aspirin-Salicylamide-Caffeine (BC HEADACHE POWDER PO) Take 1 packet by mouth 2 (two) times daily as needed (pain).   Yes Historical Provider, MD  NON FORMULARY Take 1 scoop by mouth every morning. Nitro-flex   Yes Historical Provider, MD    Allergies:   Allergies   Allergen Reactions  . Amoxicillin     Hives  . Erythromycin Hives  . Tramadol     "Makes me crazy"    Social History:  reports that he has been smoking Cigarettes.  He has been smoking about 1.00 pack per day. He does not have any smokeless tobacco history on file. He reports that he drinks alcohol. He reports that he does not use illicit drugs.  Family History: HTN, no premature CAD  Physical Exam: Filed Vitals:   11/05/14 1900 11/05/14 1902 11/05/14 1945 11/05/14 2109  BP: 189/126  169/119 157/92  Pulse: 112  102 93  Temp:      TempSrc:      Resp: 18  18 23   Height:      Weight:      SpO2: 100% 100% 100% 100%    General:  Alert and oriented times three, well developed and nourished, no acute distress Eyes: PERRLA, pink conjunctiva, no scleral icterus ENT: Moist oral mucosa, neck supple, no thyromegaly Lungs: clear to ascultation, no wheeze, no crackles, no use of accessory muscles Cardiovascular: regular rate and rhythm, no regurgitation, no gallops, no murmurs. No carotid bruits, no JVD Abdomen: soft, positive BS, non-tender, non-distended, no organomegaly, not an acute abdomen GU: not examined Neuro: CN II - XII grossly intact, sensation intact Musculoskeletal: strength 5/5 all extremities, no clubbing, cyanosis or edema Skin: no rash, no subcutaneous crepitation, no decubitus Psych: appropriate patient   Labs on Admission:   Recent Labs  11/05/14 1913  NA 140  K 3.2*  CL 104  CO2 28  GLUCOSE 117*  BUN  14  CREATININE 0.73  CALCIUM 8.9   No results for input(s): AST, ALT, ALKPHOS, BILITOT, PROT, ALBUMIN in the last 72 hours. No results for input(s): LIPASE, AMYLASE in the last 72 hours.  Recent Labs  11/05/14 1913  WBC 7.6  HGB 14.9  HCT 43.3  MCV 96.9  PLT 224   No results for input(s): CKTOTAL, CKMB, CKMBINDEX, TROPONINI in the last 72 hours. Invalid input(s): POCBNP No results for input(s): DDIMER in the last 72 hours. No results for  input(s): HGBA1C in the last 72 hours. No results for input(s): CHOL, HDL, LDLCALC, TRIG, CHOLHDL, LDLDIRECT in the last 72 hours. No results for input(s): TSH, T4TOTAL, T3FREE, THYROIDAB in the last 72 hours.  Invalid input(s): FREET3 No results for input(s): VITAMINB12, FOLATE, FERRITIN, TIBC, IRON, RETICCTPCT in the last 72 hours.  Micro Results: No results found for this or any previous visit (from the past 240 hour(s)).   Radiological Exams on Admission: Dg Chest 2 View  11/05/2014   CLINICAL DATA:  Shortness of breath and chest pain beginning last night with hypertension. Headache.  EXAM: CHEST  2 VIEW  COMPARISON:  None.  FINDINGS: Lungs are adequately inflated without focal consolidation or effusion. Cardiomediastinal silhouette is within normal. There are minimal degenerative changes of the spine.  IMPRESSION: No active cardiopulmonary disease.   Electronically Signed   By: Elberta Fortis M.D.   On: 11/05/2014 19:53   Ct Angio Chest Pe W/cm &/or Wo Cm  11/05/2014   CLINICAL DATA:  Sudden onset of shortness of breath last night, chest tightness, elevated blood pressure, smoker  EXAM: CT ANGIOGRAPHY CHEST WITH CONTRAST  TECHNIQUE: Multidetector CT imaging of the chest was performed using the standard protocol during bolus administration of intravenous contrast. Multiplanar CT image reconstructions and MIPs were obtained to evaluate the vascular anatomy.  CONTRAST:  OMNIPAQUE IOHEXOL 350 MG/ML SOLN IV  COMPARISON:  None  FINDINGS: Aorta normal caliber without aneurysm or dissection.  Borderline enlarged RIGHT paratracheal node 10 mm short axis image 31.  Upper normal sized 9 mm paraesophageal node image 66.  No additional thoracic adenopathy.  Visualized upper abdomen unremarkable.  Pulmonary arteries well opacified and patent.  No evidence of pulmonary embolism.  Calcified granuloma RIGHT upper lobe.  Mild central peribronchial thickening.  No pulmonary infiltrate, pleural effusion,  pneumothorax or additional pulmonary mass/ nodule.  Questionable wall thickening of the distal esophagus.  No acute osseous findings.  Review of the MIP images confirms the above findings.  IMPRESSION: No evidence of pulmonary embolism.  Borderline enlarged RIGHT peritracheal in upper normal sized paraesophageal lymph nodes.  Central peribronchial thickening with old granulomatous disease.  Questionable wall thickening distal esophagus, unable to exclude esophagitis or reflux disease.   Electronically Signed   By: Ulyses Southward M.D.   On: 11/05/2014 21:21    Assessment/Plan Present on Admission:  . HTN (hypertension), malignant -bring a ventilator observation  -By mouth metoprolol as well as appear and Lopressor ordered -Titrated to blood pressure medications  . SOB (shortness of breath) -Likely related to above  Hypokalemia -Replete orally Tobacco abuse -Nicotine patch over the coming nebulizers as needed  Alcohol abuse, binge drinking -Monitor. unlikely to have withdrawal symptoms   Paul Galloway 11/05/2014, 10:41 PM

## 2014-11-06 ENCOUNTER — Observation Stay (HOSPITAL_COMMUNITY): Payer: MEDICAID

## 2014-11-06 ENCOUNTER — Encounter (HOSPITAL_COMMUNITY): Payer: Self-pay | Admitting: Pulmonary Disease

## 2014-11-06 ENCOUNTER — Observation Stay (HOSPITAL_BASED_OUTPATIENT_CLINIC_OR_DEPARTMENT_OTHER): Payer: Self-pay

## 2014-11-06 DIAGNOSIS — Z720 Tobacco use: Secondary | ICD-10-CM

## 2014-11-06 DIAGNOSIS — R079 Chest pain, unspecified: Secondary | ICD-10-CM

## 2014-11-06 DIAGNOSIS — R0602 Shortness of breath: Secondary | ICD-10-CM

## 2014-11-06 DIAGNOSIS — I10 Essential (primary) hypertension: Secondary | ICD-10-CM

## 2014-11-06 LAB — CBC
HEMATOCRIT: 41.5 % (ref 39.0–52.0)
HEMATOCRIT: 43.3 % (ref 39.0–52.0)
Hemoglobin: 14.3 g/dL (ref 13.0–17.0)
Hemoglobin: 14.4 g/dL (ref 13.0–17.0)
MCH: 33.8 pg (ref 26.0–34.0)
MCH: 32.6 pg (ref 26.0–34.0)
MCHC: 33.3 g/dL (ref 30.0–36.0)
MCHC: 34.5 g/dL (ref 30.0–36.0)
MCV: 98 fL (ref 78.0–100.0)
MCV: 98.1 fL (ref 78.0–100.0)
PLATELETS: 218 10*3/uL (ref 150–400)
PLATELETS: 230 10*3/uL (ref 150–400)
RBC: 4.23 MIL/uL (ref 4.22–5.81)
RBC: 4.42 MIL/uL (ref 4.22–5.81)
RDW: 14.2 % (ref 11.5–15.5)
RDW: 14.3 % (ref 11.5–15.5)
WBC: 7.5 10*3/uL (ref 4.0–10.5)
WBC: 8.9 10*3/uL (ref 4.0–10.5)

## 2014-11-06 LAB — CK TOTAL AND CKMB (NOT AT ARMC)
CK, MB: 3.2 ng/mL (ref 0.5–5.0)
CK, MB: 3.7 ng/mL (ref 0.5–5.0)
Relative Index: 1.9 (ref 0.0–2.5)
Relative Index: 2 (ref 0.0–2.5)
Total CK: 194 U/L (ref 49–397)
Total CK: 163 U/L (ref 49–397)

## 2014-11-06 LAB — SPIROMETRY WITH GRAPH
FEF 25-75 Post: 4.6 L/sec
FEF 25-75 Pre: 3.48 L/sec
FEF2575-%Change-Post: 32 %
FEF2575-%Pred-Post: 121 %
FEF2575-%Pred-Pre: 92 %
FEV1-%Change-Post: 7 %
FEV1-%PRED-POST: 103 %
FEV1-%PRED-PRE: 95 %
FEV1-PRE: 3.93 L
FEV1-Post: 4.24 L
FEV1FVC-%CHANGE-POST: 7 %
FEV1FVC-%PRED-PRE: 98 %
FEV6-%Change-Post: 0 %
FEV6-%Pred-Post: 100 %
FEV6-%Pred-Pre: 99 %
FEV6-Post: 5.08 L
FEV6-Pre: 5.05 L
FEV6FVC-%Change-Post: 0 %
FEV6FVC-%PRED-POST: 102 %
FEV6FVC-%Pred-Pre: 102 %
FVC-%Change-Post: 0 %
FVC-%PRED-POST: 97 %
FVC-%Pred-Pre: 97 %
FVC-POST: 5.1 L
FVC-Pre: 5.06 L
POST FEV6/FVC RATIO: 100 %
Post FEV1/FVC ratio: 83 %
Pre FEV1/FVC ratio: 78 %
Pre FEV6/FVC Ratio: 100 %

## 2014-11-06 LAB — TROPONIN I
Troponin I: <0.03 ng/mL (ref <0.031)
Troponin I: <0.03 ng/mL (ref <0.031)

## 2014-11-06 LAB — BASIC METABOLIC PANEL
Anion gap: 7 (ref 5–15)
BUN: 12 mg/dL (ref 6–20)
CO2: 26 mmol/L (ref 22–32)
CREATININE: 0.72 mg/dL (ref 0.61–1.24)
Calcium: 8.9 mg/dL (ref 8.9–10.3)
Chloride: 105 mmol/L (ref 101–111)
GFR calc Af Amer: >60 mL/min (ref >60)
GLUCOSE: 100 mg/dL — AB (ref 65–99)
POTASSIUM: 3.6 mmol/L (ref 3.5–5.1)
Sodium: 138 mmol/L (ref 135–145)

## 2014-11-06 LAB — CREATININE, SERUM
CREATININE: 0.78 mg/dL (ref 0.61–1.24)
GFR calc Af Amer: >60 mL/min (ref >60)
GFR calc non Af Amer: >60 mL/min (ref >60)

## 2014-11-06 MED ORDER — ALBUTEROL SULFATE (2.5 MG/3ML) 0.083% IN NEBU
2.5000 mg | INHALATION_SOLUTION | Freq: Once | RESPIRATORY_TRACT | Status: AC
  Administered 2014-11-06: 2.5 mg via RESPIRATORY_TRACT

## 2014-11-06 MED ORDER — METOPROLOL TARTRATE 25 MG PO TABS: 25.0000 mg | ORAL_TABLET | Freq: Two times a day (BID) | ORAL | Status: DC

## 2014-11-06 NOTE — Progress Notes (Signed)
  Echocardiogram 2D Echocardiogram has been performed.  Leta JunglingCooper, Nesta Scaturro M 11/06/2014, 1:35 PM

## 2014-11-06 NOTE — Progress Notes (Signed)
Went over all discharge paperwork.  Prescription given.  Went over importance of taking medications as prescribed and following up with PCP.  All questions answered.  Pt refused to be wheeled out.  VSS.

## 2014-11-06 NOTE — Discharge Instructions (Signed)

## 2014-11-06 NOTE — Discharge Summary (Addendum)
Physician Discharge Summary  Paul Galloway ZOX:[id number] DOB: [birth year] DOA: 11/05/2014  PCP: No PCP Per Patient - case manager to provide information about community health and wellness clinic  Admit date: 11/05/2014 Discharge date: 11/06/2014  Recommendations for Outpatient Follow-up:  1. New medication for blood pressure control is metoprolol 25 mg twice daily  Discharge Diagnoses:  Active Problems:   HTN (hypertension), malignant   SOB (shortness of breath)    Discharge Condition: stable   Diet recommendation: as tolerated   History of present illness:  44 year old male, smoker but no other past medical history who presented to [facility] with worsening shortness of breath that started suddenly at rest prior to the admission. Patient reports at baseline being very active. He works as a Corporate investment banker. He was about doing his regular activities and by the time he was done he said down to rest and he noticed he couldn't catch his breath. There was no particular exertion or over activity. He reports no previous symptoms of shortness of breath. He reports no weight loss. No night sweats. No reports of chest pain or fever. No sick contacts. CT angiogram of the chest ruled out pulmonary embolism but it did show questionable old granulomatous disease. Pulmonary consulted by based on their evaluation he seems to be old and not acute issue.  Hospital Course:   Active Problems: Shortness of breath - Unclear etiology. Patient with no previous history of lung issues. He is a smoker however has not been diagnosed with COPD before. - CT angiogram of the chest revealed questionable old granuloma. Pulmonary has seen the patient in consultation and no further workup recommended. No evidence of pulmonary embolism. - Shortness of breath resolved.  Essential hypertension - Patient reports no previous diagnosis of hypertension however he does not really follow with primary care  physician so he may have had long-standing hypertension that just went untreated. - Patient started on metoprolol 25 mg twice daily - Case management to provide information about community health wellness clinic so patient can have a primary care physician on discharge.   Signed:  Manson Passey, MD  [facility] 11/06/2014, 11:14 AM  Pager #: [phone]  Time spent in minutes: less than 30 minutes  Procedures:  2 d echo  CT angiogram chest   Consultations:  Pulmonary, Dr. Max Fickle    Discharge Exam: Filed Vitals:   11/06/14 0742  BP: 153/104  Pulse:   Temp:   Resp:    Filed Vitals:   11/06/14 0332 11/06/14 0436 11/06/14 0550 11/06/14 0742  BP: 169/91 161/103 167/105 153/104  Pulse:  73    Temp:  98.3 F (36.8 C)    TempSrc:  Oral    Resp:  20    Height:  5\' 10"  (1.778 m)    Weight:   95.754 kg (211 lb 1.6 oz)   SpO2:  100%      General: Pt is alert, follows commands appropriately, not in acute distress Cardiovascular: Regular rate and rhythm, S1/S2 +, no murmurs Respiratory: Clear to auscultation bilaterally, no wheezing, no crackles, no rhonchi Abdominal: Soft, non tender, non distended, bowel sounds +, no guarding Extremities: no edema, no cyanosis, pulses palpable bilaterally DP and PT Neuro: Grossly nonfocal  Discharge Instructions  Discharge Instructions    Call MD for:  difficulty breathing, headache or visual disturbances    Complete by:  As directed      Call MD for:  persistant nausea and vomiting    Complete  by:  As directed      Call MD for:  severe uncontrolled pain    Complete by:  As directed      Diet - low sodium heart healthy    Complete by:  As directed      Discharge instructions    Complete by:  As directed   New medication for blood pressure is metoprolol 25 mg twice a day.     Increase activity slowly    Complete by:  As directed             Medication List    TAKE these medications        BC HEADACHE  POWDER PO  Take 1 packet by mouth 2 (two) times daily as needed (pain).     metoprolol tartrate 25 MG tablet  Commonly known as:  LOPRESSOR  Take 1 tablet (25 mg total) by mouth 2 (two) times daily.     NON FORMULARY  Take 1 scoop by mouth every morning. Nitro-flex           Follow-up Information    Follow up with [facility]    . Schedule an appointment as soon as possible for a visit in 1 week.   Why:  Follow up appt after recent hospitalization   Contact information:   [address] [place] [id number] [phone]       The results of significant diagnostics from this hospitalization (including imaging, microbiology, ancillary and laboratory) are listed below for reference.    Significant Diagnostic Studies: Dg Chest 2 View  11/05/2014   CLINICAL DATA:  Shortness of breath and chest pain beginning last night with hypertension. Headache.  EXAM: CHEST  2 VIEW  COMPARISON:  None.  FINDINGS: Lungs are adequately inflated without focal consolidation or effusion. Cardiomediastinal silhouette is within normal. There are minimal degenerative changes of the spine.  IMPRESSION: No active cardiopulmonary disease.   Electronically Signed   By: Elberta Fortisaniel  Boyle M.D.   On: 11/05/2014 19:53   Ct Angio Chest Pe W/cm &/or Wo Cm  11/05/2014   CLINICAL DATA:  Sudden onset of shortness of breath last night, chest tightness, elevated blood pressure, smoker  EXAM: CT ANGIOGRAPHY CHEST WITH CONTRAST  TECHNIQUE: Multidetector CT imaging of the chest was performed using the standard protocol during bolus administration of intravenous contrast. Multiplanar CT image reconstructions and MIPs were obtained to evaluate the vascular anatomy.  CONTRAST:  100mL OMNIPAQUE IOHEXOL 350 MG/ML SOLN IV  COMPARISON:  None  FINDINGS: Aorta normal caliber without aneurysm or dissection.  Borderline enlarged RIGHT paratracheal node 10 mm short axis image 31.  Upper normal  sized 9 mm paraesophageal node image 66.  No additional thoracic adenopathy.  Visualized upper abdomen unremarkable.  Pulmonary arteries well opacified and patent.  No evidence of pulmonary embolism.  Calcified granuloma RIGHT upper lobe.  Mild central peribronchial thickening.  No pulmonary infiltrate, pleural effusion, pneumothorax or additional pulmonary mass/ nodule.  Questionable wall thickening of the distal esophagus.  No acute osseous findings.  Review of the MIP images confirms the above findings.  IMPRESSION: No evidence of pulmonary embolism.  Borderline enlarged RIGHT peritracheal in upper normal sized paraesophageal lymph nodes.  Central peribronchial thickening with old granulomatous disease.  Questionable wall thickening distal esophagus, unable to exclude esophagitis or reflux disease.   Electronically Signed   By: Ulyses SouthwardMark  Boles M.D.   On: 11/05/2014 21:21    Microbiology: No results found  for this or any previous visit (from the past 240 hour(s)).   Labs: Basic Metabolic Panel:  Recent Labs Lab 11/05/14 1913 11/06/14 0050 11/06/14 0450  NA 140  --  138  K 3.2*  --  3.6  CL 104  --  105  CO2 28  --  26  GLUCOSE 117*  --  100*  BUN 14  --  12  CREATININE 0.73 0.78 0.72  CALCIUM 8.9  --  8.9   Liver Function Tests: No results for input(s): AST, ALT, ALKPHOS, BILITOT, PROT, ALBUMIN in the last 168 hours. No results for input(s): LIPASE, AMYLASE in the last 168 hours. No results for input(s): AMMONIA in the last 168 hours. CBC:  Recent Labs Lab 11/05/14 1913 11/06/14 0050 11/06/14 0450  WBC 7.6 8.9 7.5  HGB 14.9 14.3 14.4  HCT 43.3 41.5 43.3  MCV 96.9 98.1 98.0  PLT 224 230 218   Cardiac Enzymes:  Recent Labs Lab 11/06/14 0050 11/06/14 0450  CKTOTAL 194 163  CKMB 3.7 3.2  TROPONINI <0.03 <0.03   BNP: BNP (last 3 results)  Recent Labs  11/05/14 1913  BNP 95.7    ProBNP (last 3 results) No results for input(s): PROBNP in the last 8760  hours.  CBG: No results for input(s): GLUCAP in the last 168 hours.

## 2014-11-06 NOTE — Progress Notes (Signed)
Patient's scheduled lopressor was given last night around midnight and this morning, SBP was above 160, so RN gave PRN IV lopressor. One hour after it was given, patient;s BP was 167/105. NP made aware. Will continue to monitor.

## 2014-11-06 NOTE — Consult Note (Signed)
Name: Paul Galloway MRN: [id number] DOB: [birth year]    ADMISSION DATE:  11/05/2014 CONSULTATION DATE:  11/06/14  REFERRING MD :  Dr. Elisabeth Pigeonevine   CHIEF COMPLAINT:  SOB  BRIEF PATIENT DESCRIPTION: 44 y/o M, current smoker, admitted 7/18 with acute onset shortness of breath and chest pain/pressure.  Found to have hypertensive urgency.  CTA assessed to rule out PE and discovered incidental finding of granuloma.  PCCM consulted for evaluation.    SIGNIFICANT EVENTS  7/18  Admit with SOB & Chest pressure / pain   STUDIES:  7/18  CTA Chest >> neg for PE, central peribronchial thickening with old granulomatous disease 7/19  ECHO >>  7/19  Spirometry >>    HISTORY OF PRESENT ILLNESS:  44 y/o M, current tobacco / marijuana smoker, and PMH of umbilical hernia repair, knee & hand surgery who was admitted 7/18 with acute onset shortness of breath and chest pain/pressure.    The patient reported he had abrupt onset of shortness of breath at rest.  He describes it as a sensation of "breathing but smothering".  The patient notes a similar episode a few days prior to admit while walking up a hill and he became short of breath with mid-sternal chest pressure which resolved with rest.  He denies known wheezing, sputum productionor significant cough.  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  [place]  Worked in the past with asbestos, Horticulturist, commercialaircraft repair for [employer] with significant fume exposures (wore mask).  He reports an episode approximately 2 months prior to admit where he was working under a house tearing out flooring and the home has been quarantined for mold.  During that period of work he and his co-workers developed a discrete rash on arms and back.  The rash was painful and initially went away.  However, it reoccurred and then eventually went away.       ER work up found to have hypertensive urgency with an initial BP of 219/114.   CTA assessed to rule out PE (negative) and discovered incidental finding of RUL granuloma.  Lab assessment was within normal limits (BMP/CBC), troponin was negative.  EKG notable for trigeminy and LVH.   [facility]  PCCM consulted for evaluation of granulomatous finding on CT.    PAST MEDICAL HISTORY :   has no past medical history on file.  has past surgical history that includes Hernia repair; right hand surgery; and knee sugery.   Prior to Admission medications   Medication Sig Start Date End Date Taking? Authorizing Provider  Aspirin-Salicylamide-Caffeine (BC HEADACHE POWDER PO) Take 1 packet by mouth 2 (two) times daily as needed (pain).   Yes Historical Provider, MD  NON FORMULARY Take 1 scoop by mouth every morning. Nitro-flex   Yes Historical Provider, MD   Allergies  Allergen Reactions  . Amoxicillin     Hives  . Erythromycin Hives  . Tramadol     "Makes me crazy"    FAMILY HISTORY:  family history is not on file.   SOCIAL HISTORY:  reports that he has been smoking Cigarettes.  He has been smoking about 1.00 pack per day. He does not have any smokeless tobacco history on file. He reports that he drinks alcohol. He reports that he does not use illicit drugs.  REVIEW OF SYSTEMS:   Constitutional: Negative for fever, chills, weight loss, malaise/fatigue  and diaphoresis.  HENT: Negative for hearing loss, ear pain, nosebleeds, congestion, sore throat, neck pain, tinnitus and ear discharge.   Eyes: Negative for blurred vision, double vision, photophobia, pain, discharge and redness.  Respiratory: Negative for cough, hemoptysis, sputum production, wheezing and stridor. Reports shortness of breath at rest & with activity.    Cardiovascular: Negative for palpitations, orthopnea, claudication, leg swelling and PND. Reports mid-sternal chest pain  Gastrointestinal: Negative for heartburn, nausea, vomiting, abdominal pain, diarrhea, constipation,  blood in stool and melena.  Genitourinary: Negative for dysuria, urgency, frequency, hematuria and flank pain.  Musculoskeletal: Negative for myalgias, back pain, joint pain and falls.  Skin: Negative for itching and rash.  Neurological: Negative for dizziness, tingling, tremors, sensory change, speech change, focal weakness, seizures, loss of consciousness, weakness and headaches.  Endo/Heme/Allergies: Negative for environmental allergies and polydipsia. Does not bruise/bleed easily.  SUBJECTIVE:   VITAL SIGNS: Temp:  [98.2 F (36.8 C)-98.4 F (36.9 C)] 98.3 F (36.8 C) (07/19 0436) Pulse Rate:  [73-116] 73 (07/19 0436) Resp:  [11-29] 20 (07/19 0436) BP: (141-219)/(82-126) 153/104 mmHg (07/19 0742) SpO2:  [96 %-100 %] 100 % (07/19 0436) Weight:  [200 lb (90.719 kg)-211 lb 1.6 oz (95.754 kg)] 211 lb 1.6 oz (95.754 kg) (07/19 0550)  PHYSICAL EXAMINATION: General:  wdwn adult male in NAD at rest  Neuro:  AAOx4, speech clear, MAE HEENT:  MM pink/moist, no jvd Cardiovascular:  RRR, S4 noted, no JVD Lungs:  Insp and exp wheezing noted, good air movement Abdomen:  NTND, bsx4 active, tolerating PO's  Musculoskeletal:  No acute deformities, MAE without difficulty Skin:  Warm/dry, R cheek pearly lesion (? Basal cell) Extremities:  No clubbing or cyanosis    Recent Labs Lab 11/05/14 1913 11/06/14 0050 11/06/14 0450  NA 140  --  138  K 3.2*  --  3.6  CL 104  --  105  CO2 28  --  26  BUN 14  --  12  CREATININE 0.73 0.78 0.72  GLUCOSE 117*  --  100*    Recent Labs Lab 11/05/14 1913 11/06/14 0050 11/06/14 0450  HGB 14.9 14.3 14.4  HCT 43.3 41.5 43.3  WBC 7.6 8.9 7.5  PLT 224 230 218   Dg Chest 2 View  11/05/2014   CLINICAL DATA:  Shortness of breath and chest pain beginning last night with hypertension. Headache.  EXAM: CHEST  2 VIEW  COMPARISON:  None.  FINDINGS: Lungs are adequately inflated without focal consolidation or effusion. Cardiomediastinal silhouette is within  normal. There are minimal degenerative changes of the spine.  IMPRESSION: No active cardiopulmonary disease.   Electronically Signed   By: Elberta Fortis M.D.   On: 11/05/2014 19:53   Ct Angio Chest Pe W/cm &/or Wo Cm  11/05/2014   CLINICAL DATA:  Sudden onset of shortness of breath last night, chest tightness, elevated blood pressure, smoker  EXAM: CT ANGIOGRAPHY CHEST WITH CONTRAST  TECHNIQUE: Multidetector CT imaging of the chest was performed using the standard protocol during bolus administration of intravenous contrast. Multiplanar CT image reconstructions and MIPs were obtained to evaluate the vascular anatomy.  CONTRAST:  OMNIPAQUE IOHEXOL 350 MG/ML SOLN IV  COMPARISON:  None  FINDINGS: Aorta normal caliber without aneurysm or dissection.  Borderline enlarged RIGHT paratracheal node 10 mm short axis image 31.  Upper normal sized 9 mm paraesophageal node image 66.  No additional thoracic adenopathy.  Visualized upper abdomen unremarkable.  Pulmonary arteries well opacified and patent.  No evidence  of pulmonary embolism.  Calcified granuloma RIGHT upper lobe.  Mild central peribronchial thickening.  No pulmonary infiltrate, pleural effusion, pneumothorax or additional pulmonary mass/ nodule.  Questionable wall thickening of the distal esophagus.  No acute osseous findings.  Review of the MIP images confirms the above findings.  IMPRESSION: No evidence of pulmonary embolism.  Borderline enlarged RIGHT peritracheal in upper normal sized paraesophageal lymph nodes.  Central peribronchial thickening with old granulomatous disease.  Questionable wall thickening distal esophagus, unable to exclude esophagitis or reflux disease.   Electronically Signed   By: Ulyses Southward M.D.   On: 11/05/2014 21:21    ASSESSMENT / PLAN:  Discussion: I think his biggest issue this hospitalization is his hypertension.  Suspect he has some degree of LVH.  Yesterday's dyspnea most likely related to his hypertension and the  hot/humid air.  He may have COPD given his smoking history and wheezing on exam.  The granuloma on exam is consistent with an old fungal infection and does not appear acute.    RUL Calcified Granuloma  Dyspnea  Rule Out Obstructive Disease  Tobacco Abuse   Plan: Assess spirometry  PRN Duoneb Smoking cessation counseling   Chest Pain / Pressure Hypertensive Urgency   Plan:  Will order ECHO  ASA BP control per primary  Will need to establish a PCP for discharge    ? Basal Cell Carcinoma   Plan:  Outpatient follow up with PCP    Heber Cherry, MD [facility] Pager: [phone] Cell: [phone] After 3pm or if no response, call [phone]  11/06/2014, 10:26 AM

## 2014-11-06 NOTE — Care Management Note (Signed)
Case Management Note  Patient Details  Name: Paul Galloway MRN: [id number] Date of Birth: [birth year]  Subjective/Objective:   HTN                 Action/Plan:  From home, returning home   Expected Discharge Date:   (unknown)               Expected Discharge Plan:  Home w Home Health Services  In-House Referral:     Discharge planning Services  CM Consult, Follow-up appt scheduled  Post Acute Care Choice:    Choice offered to:     DME Arranged:    DME Agency:     HH Arranged:    HH Agency:     Status of Service:  In process, will continue to follow  Medicare Important Message Given:    Date Medicare IM Given:    Medicare IM give by:    Date Additional Medicare IM Given:    Additional Medicare Important Message give by:     If discussed at Long Length of Stay Meetings, dates discussed:    Additional Comments:[facility] at 12 Noon [facility]  Sherese Heyward, YettemMyraette, RN 11/06/2014, 12:05 PM

## 2014-11-06 NOTE — Progress Notes (Signed)
I agree with the previous nurse's assessment 

## 2014-11-06 NOTE — Progress Notes (Signed)
Pt agreed with appointment with [facility] on 7/26 at 12 noon.

## 2014-11-13 ENCOUNTER — Encounter: Payer: Self-pay | Admitting: Family Medicine

## 2014-11-13 ENCOUNTER — Ambulatory Visit: Payer: Self-pay | Attending: Family Medicine | Admitting: Family Medicine

## 2014-11-13 VITALS — BP 144/82 | HR 70 | Temp 98.5°F | Resp 18 | Ht 71.0 in | Wt 206.0 lb

## 2014-11-13 DIAGNOSIS — I10 Essential (primary) hypertension: Secondary | ICD-10-CM | POA: Insufficient documentation

## 2014-11-13 DIAGNOSIS — F17200 Nicotine dependence, unspecified, uncomplicated: Secondary | ICD-10-CM

## 2014-11-13 DIAGNOSIS — H6120 Impacted cerumen, unspecified ear: Secondary | ICD-10-CM | POA: Insufficient documentation

## 2014-11-13 DIAGNOSIS — F17210 Nicotine dependence, cigarettes, uncomplicated: Secondary | ICD-10-CM | POA: Insufficient documentation

## 2014-11-13 DIAGNOSIS — Z85828 Personal history of other malignant neoplasm of skin: Secondary | ICD-10-CM | POA: Insufficient documentation

## 2014-11-13 DIAGNOSIS — L989 Disorder of the skin and subcutaneous tissue, unspecified: Secondary | ICD-10-CM | POA: Insufficient documentation

## 2014-11-13 DIAGNOSIS — H6123 Impacted cerumen, bilateral: Secondary | ICD-10-CM

## 2014-11-13 DIAGNOSIS — Z720 Tobacco use: Secondary | ICD-10-CM

## 2014-11-13 MED ORDER — METOPROLOL TARTRATE 25 MG PO TABS: 25.0000 mg | ORAL_TABLET | Freq: Two times a day (BID) | ORAL | Status: DC

## 2014-11-13 NOTE — Progress Notes (Signed)
Subjective:    Patient ID: Paul Galloway, male    DOB: 10-24-1970, 44 y.o.   MRN: [id number]  HPI Paul Galloway is a 44 year old male smoker who had presented to the ED at [facility] with shortness of breath and the absence of chest pain. CT angiogram of the chest was negative for pulmonary embolism but it did show questionable old granulomatous disease. Pulmonary consulted by based on their evaluation he seems to be old and not acute issue. He was found to have an elevated blood pressure 189/126, he was tachycardic at 112 and oxygen saturation was 100%; he was commenced on metoprolol with resulting improvement in his blood pressure. He was seen by pulmonary and did have pulmonary function test which came back normal; there was no clear etiology for shortness of breath was subsequently improved after which he was subsequently discharged.  He feels fine today and complains of wax in both ears to the extent that it affects his hearing. He also has a facial lesion the right side of his nose which she would like looked at. Endorses a previous history of basal cell carcinoma on his arms which was excised several years ago.  History reviewed. No pertinent past medical history.  Past Surgical History  Procedure Laterality Date  . Hernia repair    . Right hand surgery    . Knee sugery      History   Social History  . Marital Status: Legally Separated    Spouse Name: N/A  . Number of Children: N/A  . Years of Education: N/A   Occupational History  . Not on file.   Social History Main Topics  . Smoking status: Current Every Day Smoker -- 1.00 packs/day for 12 years    Types: Cigarettes  . Smokeless tobacco: Not on file  . Alcohol Use: 0.0 oz/week    0 Standard drinks or equivalent per week     Comment: 1-2 5ths of a bottle of liquor on the weekends  . Drug Use: No  . Sexual Activity: Not on file   Other Topics Concern  . Not on file   Social History Narrative    Allergies    Allergen Reactions  . Amoxicillin     Hives  . Erythromycin Hives  . Tramadol     "Makes me crazy"    Current Outpatient Prescriptions on File Prior to Visit  Medication Sig Dispense Refill  . Aspirin-Salicylamide-Caffeine (BC HEADACHE POWDER PO) Take 1 packet by mouth 2 (two) times daily as needed (pain).    . NON FORMULARY Take 1 scoop by mouth every morning. Nitro-flex     No current facility-administered medications on file prior to visit.     Review of Systems  Constitutional: Negative for activity change and appetite change.  HENT:       See hpi  Eyes: Negative for visual disturbance.  Respiratory: Negative for chest tightness and shortness of breath.   Cardiovascular: Negative for chest pain and palpitations.  Gastrointestinal: Negative for abdominal pain and abdominal distention.  Endocrine: Negative for cold intolerance, heat intolerance and polyphagia.  Genitourinary: Negative for dysuria, frequency and difficulty urinating.  Musculoskeletal: Negative for back pain, joint swelling and arthralgias.  Skin:       See hpi  Neurological: Negative for dizziness, tremors and weakness.  Psychiatric/Behavioral: Negative for suicidal ideas and behavioral problems.         Objective: Filed Vitals:   11/13/14 1238  BP: 144/82  Pulse: 70  Temp: 98.5 F (36.9 C)  TempSrc: Oral  Resp: 18  Height:  (1.803 m)  Weight: 206 lb (93.441 kg)  SpO2: 98%      Physical Exam  Constitutional: He is oriented to person, place, and time. He appears well-developed and well-nourished.  HENT:  Cerumen obscuring bilateral TM   Eyes: Conjunctivae and EOM are normal. Pupils are equal, round, and reactive to light.  Neck: Normal range of motion. Neck supple. No tracheal deviation present.  Cardiovascular: Normal rate, regular rhythm and normal heart sounds.   No murmur heard. Pulmonary/Chest: Effort normal and breath sounds normal. No respiratory distress. He has no wheezes. He  exhibits no tenderness.  Abdominal: Soft. Bowel sounds are normal. He exhibits no mass. There is no tenderness.  Musculoskeletal: Normal range of motion. He exhibits no edema or tenderness.  Neurological: He is alert and oriented to person, place, and time.  Skin: Skin is warm and dry.  Right facial lesion lateral to nose which is raised and with superficial scaling  Psychiatric: He has a normal mood and affect.     Dg Chest 2 View  11/05/2014   CLINICAL DATA:  Shortness of breath and chest pain beginning last night with hypertension. Headache.  EXAM: CHEST  2 VIEW  COMPARISON:  None.  FINDINGS: Lungs are adequately inflated without focal consolidation or effusion. Cardiomediastinal silhouette is within normal. There are minimal degenerative changes of the spine.  IMPRESSION: No active cardiopulmonary disease.   Electronically Signed   By: Elberta Fortis M.D.   On: 11/05/2014 19:53   Ct Angio Chest Pe W/cm &/or Wo Cm  11/05/2014   CLINICAL DATA:  Sudden onset of shortness of breath last night, chest tightness, elevated blood pressure, smoker  EXAM: CT ANGIOGRAPHY CHEST WITH CONTRAST  TECHNIQUE: Multidetector CT imaging of the chest was performed using the standard protocol during bolus administration of intravenous contrast. Multiplanar CT image reconstructions and MIPs were obtained to evaluate the vascular anatomy.  CONTRAST:  OMNIPAQUE IOHEXOL 350 MG/ML SOLN IV  COMPARISON:  None  FINDINGS: Aorta normal caliber without aneurysm or dissection.  Borderline enlarged RIGHT paratracheal node 10 mm short axis image 31.  Upper normal sized 9 mm paraesophageal node image 66.  No additional thoracic adenopathy.  Visualized upper abdomen unremarkable.  Pulmonary arteries well opacified and patent.  No evidence of pulmonary embolism.  Calcified granuloma RIGHT upper lobe.  Mild central peribronchial thickening.  No pulmonary infiltrate, pleural effusion, pneumothorax or additional pulmonary mass/ nodule.   Questionable wall thickening of the distal esophagus.  No acute osseous findings.  Review of the MIP images confirms the above findings.  IMPRESSION: No evidence of pulmonary embolism.  Borderline enlarged RIGHT peritracheal in upper normal sized paraesophageal lymph nodes.  Central peribronchial thickening with old granulomatous disease.  Questionable wall thickening distal esophagus, unable to exclude esophagitis or reflux disease.   Electronically Signed   By: Ulyses Southward M.D.   On: 11/05/2014 21:21         Assessment & Plan:  This patient is a 44 year old male with newly diagnosed hypertension here to establish care. Hypertension:  Blood pressure slightly above goal of <140/90 Continue current regimen and will reassess Blood pressure at next visit Low sodium, DASH diet.  Cerumen impaction: Ear irrigation performed  Facial lesion: Will need to exclude BCC Referred to Dermatology  Tobacco abuse: Smoking cessation support: smoking cessation hotline: 1-800-QUIT-NOW.  Smoking cessation classes are available through [facility]. Call [phone] or visit our website at [email or web address].  Spent 3 minutes counseling on smoking cessation and patient is not ready to quit.   Jaclyn Shaggy MD

## 2014-11-13 NOTE — Progress Notes (Signed)
Patient here for HFU for HTN. Patient denies any pain today. Patient reports flutters in the middle of his chest. Patient has taken all his medications for today. Patient request for the doctor to check his ears. Patient smokes 1-2 packs of cigarettes daily.

## 2014-11-13 NOTE — Patient Instructions (Signed)

## 2014-11-21 ENCOUNTER — Emergency Department (HOSPITAL_COMMUNITY): Payer: Medicaid Other

## 2014-11-21 ENCOUNTER — Encounter (HOSPITAL_COMMUNITY): Payer: Self-pay | Admitting: Emergency Medicine

## 2014-11-21 ENCOUNTER — Inpatient Hospital Stay (HOSPITAL_COMMUNITY)
Admission: EM | Admit: 2014-11-21 | Discharge: 2014-11-28 | DRG: 964 | Disposition: A | Discharge status: Rehabilitation Facility | Payer: Medicaid Other | Attending: General Surgery | Admitting: General Surgery

## 2014-11-21 DIAGNOSIS — Y905 Blood alcohol level of 100-119 mg/100 ml: Secondary | ICD-10-CM | POA: Diagnosis present

## 2014-11-21 DIAGNOSIS — S27329A Contusion of lung, unspecified, initial encounter: Secondary | ICD-10-CM

## 2014-11-21 DIAGNOSIS — D62 Acute posthemorrhagic anemia: Secondary | ICD-10-CM | POA: Diagnosis not present

## 2014-11-21 DIAGNOSIS — F10129 Alcohol abuse with intoxication, unspecified: Secondary | ICD-10-CM | POA: Diagnosis present

## 2014-11-21 DIAGNOSIS — T07XXXA Unspecified multiple injuries, initial encounter: Secondary | ICD-10-CM

## 2014-11-21 DIAGNOSIS — S01511A Laceration without foreign body of lip, initial encounter: Secondary | ICD-10-CM | POA: Diagnosis present

## 2014-11-21 DIAGNOSIS — T148XXA Other injury of unspecified body region, initial encounter: Secondary | ICD-10-CM

## 2014-11-21 DIAGNOSIS — S32401A Unspecified fracture of right acetabulum, initial encounter for closed fracture: Secondary | ICD-10-CM | POA: Diagnosis present

## 2014-11-21 DIAGNOSIS — K5900 Constipation, unspecified: Secondary | ICD-10-CM | POA: Diagnosis not present

## 2014-11-21 DIAGNOSIS — S72051A Unspecified fracture of head of right femur, initial encounter for closed fracture: Secondary | ICD-10-CM | POA: Diagnosis present

## 2014-11-21 DIAGNOSIS — S129XXA Fracture of neck, unspecified, initial encounter: Secondary | ICD-10-CM | POA: Diagnosis present

## 2014-11-21 DIAGNOSIS — S2220XA Unspecified fracture of sternum, initial encounter for closed fracture: Secondary | ICD-10-CM | POA: Diagnosis present

## 2014-11-21 DIAGNOSIS — S12600A Unspecified displaced fracture of seventh cervical vertebra, initial encounter for closed fracture: Secondary | ICD-10-CM | POA: Diagnosis present

## 2014-11-21 DIAGNOSIS — E860 Dehydration: Secondary | ICD-10-CM | POA: Diagnosis not present

## 2014-11-21 DIAGNOSIS — F419 Anxiety disorder, unspecified: Secondary | ICD-10-CM | POA: Diagnosis present

## 2014-11-21 DIAGNOSIS — S27322A Contusion of lung, bilateral, initial encounter: Secondary | ICD-10-CM | POA: Diagnosis present

## 2014-11-21 DIAGNOSIS — S73004A Unspecified dislocation of right hip, initial encounter: Secondary | ICD-10-CM | POA: Diagnosis present

## 2014-11-21 DIAGNOSIS — M79604 Pain in right leg: Secondary | ICD-10-CM

## 2014-11-21 DIAGNOSIS — I10 Essential (primary) hypertension: Secondary | ICD-10-CM | POA: Diagnosis present

## 2014-11-21 DIAGNOSIS — S12100A Unspecified displaced fracture of second cervical vertebra, initial encounter for closed fracture: Principal | ICD-10-CM | POA: Diagnosis present

## 2014-11-21 DIAGNOSIS — F17210 Nicotine dependence, cigarettes, uncomplicated: Secondary | ICD-10-CM | POA: Diagnosis present

## 2014-11-21 DIAGNOSIS — S72001A Fracture of unspecified part of neck of right femur, initial encounter for closed fracture: Secondary | ICD-10-CM

## 2014-11-21 DIAGNOSIS — Z9119 Patient's noncompliance with other medical treatment and regimen: Secondary | ICD-10-CM | POA: Diagnosis present

## 2014-11-21 HISTORY — DX: Essential (primary) hypertension: I10

## 2014-11-21 HISTORY — PX: CLOSED REDUCTION HIP DISLOCATION: SUR221

## 2014-11-21 LAB — I-STAT CHEM 8, ED
BUN: 25 mg/dL — ABNORMAL HIGH (ref 6–20)
Calcium, Ion: 1.09 mmol/L — ABNORMAL LOW (ref 1.12–1.23)
Chloride: 105 mmol/L (ref 101–111)
Creatinine, Ser: 1.1 mg/dL (ref 0.61–1.24)
Glucose, Bld: 110 mg/dL — ABNORMAL HIGH (ref 65–99)
HCT: 50 % (ref 39.0–52.0)
Hemoglobin: 17 g/dL (ref 13.0–17.0)
Potassium: 3.5 mmol/L (ref 3.5–5.1)
SODIUM: 140 mmol/L (ref 135–145)
TCO2: 19 mmol/L (ref 0–100)

## 2014-11-21 LAB — SAMPLE TO BLOOD BANK

## 2014-11-21 MED ORDER — FENTANYL CITRATE (PF) 100 MCG/2ML IJ SOLN
50.0000 ug | Freq: Once | INTRAMUSCULAR | Status: AC
  Administered 2014-11-21: 50 ug via INTRAVENOUS
  Filled 2014-11-21: qty 2

## 2014-11-21 MED ORDER — IOHEXOL 300 MG/ML  SOLN
80.0000 mL | Freq: Once | INTRAMUSCULAR | Status: AC | PRN
  Administered 2014-11-21: 80 mL via INTRAVENOUS

## 2014-11-21 MED ORDER — ONDANSETRON HCL 4 MG/2ML IJ SOLN
4.0000 mg | Freq: Once | INTRAMUSCULAR | Status: AC
  Administered 2014-11-21: 4 mg via INTRAVENOUS
  Filled 2014-11-21: qty 2
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
## 2014-11-21 NOTE — ED Notes (Signed)
Pt back from CT
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
## 2014-11-21 NOTE — ED Notes (Signed)
Notified ED Doctor of patient.
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
## 2014-11-22 ENCOUNTER — Encounter (HOSPITAL_COMMUNITY)
Admission: EM | Disposition: A | Discharge status: Rehabilitation Facility | Payer: Self-pay | Source: Home / Self Care | Attending: Orthopedic Surgery

## 2014-11-22 ENCOUNTER — Emergency Department (HOSPITAL_COMMUNITY): Payer: Medicaid Other

## 2014-11-22 ENCOUNTER — Inpatient Hospital Stay (HOSPITAL_COMMUNITY): Payer: Medicaid Other

## 2014-11-22 ENCOUNTER — Emergency Department (HOSPITAL_COMMUNITY): Payer: Medicaid Other | Admitting: Anesthesiology

## 2014-11-22 ENCOUNTER — Encounter (HOSPITAL_COMMUNITY): Payer: Self-pay | Admitting: Anesthesiology

## 2014-11-22 DIAGNOSIS — F419 Anxiety disorder, unspecified: Secondary | ICD-10-CM | POA: Diagnosis present

## 2014-11-22 DIAGNOSIS — K5900 Constipation, unspecified: Secondary | ICD-10-CM | POA: Diagnosis not present

## 2014-11-22 DIAGNOSIS — S12100A Unspecified displaced fracture of second cervical vertebra, initial encounter for closed fracture: Secondary | ICD-10-CM | POA: Diagnosis present

## 2014-11-22 DIAGNOSIS — Y905 Blood alcohol level of 100-119 mg/100 ml: Secondary | ICD-10-CM | POA: Diagnosis present

## 2014-11-22 DIAGNOSIS — Z9119 Patient's noncompliance with other medical treatment and regimen: Secondary | ICD-10-CM | POA: Diagnosis present

## 2014-11-22 DIAGNOSIS — E860 Dehydration: Secondary | ICD-10-CM | POA: Diagnosis not present

## 2014-11-22 DIAGNOSIS — S2220XA Unspecified fracture of sternum, initial encounter for closed fracture: Secondary | ICD-10-CM | POA: Diagnosis present

## 2014-11-22 DIAGNOSIS — S01511A Laceration without foreign body of lip, initial encounter: Secondary | ICD-10-CM | POA: Diagnosis present

## 2014-11-22 DIAGNOSIS — D62 Acute posthemorrhagic anemia: Secondary | ICD-10-CM | POA: Diagnosis not present

## 2014-11-22 DIAGNOSIS — F17210 Nicotine dependence, cigarettes, uncomplicated: Secondary | ICD-10-CM | POA: Diagnosis present

## 2014-11-22 DIAGNOSIS — I10 Essential (primary) hypertension: Secondary | ICD-10-CM | POA: Diagnosis present

## 2014-11-22 DIAGNOSIS — T07XXXA Unspecified multiple injuries, initial encounter: Secondary | ICD-10-CM

## 2014-11-22 DIAGNOSIS — S27322A Contusion of lung, bilateral, initial encounter: Secondary | ICD-10-CM | POA: Diagnosis present

## 2014-11-22 DIAGNOSIS — M79604 Pain in right leg: Secondary | ICD-10-CM | POA: Diagnosis present

## 2014-11-22 DIAGNOSIS — S72051A Unspecified fracture of head of right femur, initial encounter for closed fracture: Secondary | ICD-10-CM | POA: Diagnosis present

## 2014-11-22 DIAGNOSIS — S32401A Unspecified fracture of right acetabulum, initial encounter for closed fracture: Secondary | ICD-10-CM | POA: Diagnosis present

## 2014-11-22 DIAGNOSIS — S73004A Unspecified dislocation of right hip, initial encounter: Secondary | ICD-10-CM | POA: Diagnosis present

## 2014-11-22 DIAGNOSIS — F10129 Alcohol abuse with intoxication, unspecified: Secondary | ICD-10-CM | POA: Diagnosis present

## 2014-11-22 DIAGNOSIS — S12600A Unspecified displaced fracture of seventh cervical vertebra, initial encounter for closed fracture: Secondary | ICD-10-CM | POA: Diagnosis present

## 2014-11-22 HISTORY — PX: HIP CLOSED REDUCTION: SHX983

## 2014-11-22 LAB — COMPREHENSIVE METABOLIC PANEL
ALBUMIN: 3.7 g/dL (ref 3.5–5.0)
ALK PHOS: 80 U/L (ref 38–126)
ALT: 31 U/L (ref 17–63)
ANION GAP: 13 (ref 5–15)
AST: 36 U/L (ref 15–41)
BUN: 21 mg/dL — AB (ref 6–20)
CO2: 19 mmol/L — ABNORMAL LOW (ref 22–32)
Calcium: 8.6 mg/dL — ABNORMAL LOW (ref 8.9–10.3)
Chloride: 107 mmol/L (ref 101–111)
Creatinine, Ser: 0.93 mg/dL (ref 0.61–1.24)
GFR calc Af Amer: >60 mL/min (ref >60)
GLUCOSE: 111 mg/dL — AB (ref 65–99)
Potassium: 3.6 mmol/L (ref 3.5–5.1)
Sodium: 139 mmol/L (ref 135–145)
Total Bilirubin: 0.3 mg/dL (ref 0.3–1.2)
Total Protein: 6.3 g/dL — ABNORMAL LOW (ref 6.5–8.1)

## 2014-11-22 LAB — PROTIME-INR
INR: 1.04 (ref 0.00–1.49)
Prothrombin Time: 13.8 seconds (ref 11.6–15.2)

## 2014-11-22 LAB — URINE MICROSCOPIC-ADD ON

## 2014-11-22 LAB — CDS SEROLOGY

## 2014-11-22 LAB — URINALYSIS, ROUTINE W REFLEX MICROSCOPIC
BILIRUBIN URINE: NEGATIVE
Glucose, UA: NEGATIVE mg/dL
KETONES UR: NEGATIVE mg/dL
Leukocytes, UA: NEGATIVE
Nitrite: NEGATIVE
Protein, ur: NEGATIVE mg/dL
Specific Gravity, Urine: 1.03 (ref 1.005–1.030)
Urobilinogen, UA: 0.2 mg/dL (ref 0.0–1.0)
pH: 5.5 (ref 5.0–8.0)

## 2014-11-22 LAB — CBC
HEMATOCRIT: 45.7 % (ref 39.0–52.0)
HEMOGLOBIN: 15.8 g/dL (ref 13.0–17.0)
MCH: 33.3 pg (ref 26.0–34.0)
MCHC: 34.6 g/dL (ref 30.0–36.0)
MCV: 96.4 fL (ref 78.0–100.0)
Platelets: 195 10*3/uL (ref 150–400)
RBC: 4.74 MIL/uL (ref 4.22–5.81)
RDW: 14 % (ref 11.5–15.5)
WBC: 12.6 10*3/uL — ABNORMAL HIGH (ref 4.0–10.5)

## 2014-11-22 LAB — ETHANOL: ALCOHOL ETHYL (B): 110 mg/dL — AB (ref <5)

## 2014-11-22 SURGERY — CLOSED MANIPULATION, JOINT, HIP
Anesthesia: Spinal | Laterality: Right

## 2014-11-22 MED ORDER — SUCCINYLCHOLINE CHLORIDE 20 MG/ML IJ SOLN
INTRAMUSCULAR | Status: DC | PRN
  Administered 2014-11-22: 120 mg via INTRAVENOUS

## 2014-11-22 MED ORDER — PROMETHAZINE HCL 25 MG/ML IJ SOLN: 6.2500 mg | INTRAMUSCULAR | Status: DC | PRN

## 2014-11-22 MED ORDER — OXYCODONE-ACETAMINOPHEN 5-325 MG PO TABS
2.0000 | ORAL_TABLET | ORAL | Status: DC | PRN
  Administered 2014-11-22: 2 via ORAL
  Filled 2014-11-22: qty 2

## 2014-11-22 MED ORDER — HYDROMORPHONE HCL 1 MG/ML IJ SOLN
1.0000 mg | Freq: Once | INTRAMUSCULAR | Status: AC
  Administered 2014-11-22: 1 mg via INTRAVENOUS
  Filled 2014-11-22: qty 1

## 2014-11-22 MED ORDER — ACETAMINOPHEN 650 MG RE SUPP
650.0000 mg | Freq: Four times a day (QID) | RECTAL | Status: DC | PRN
Start: 2014-11-22 — End: 2014-11-26

## 2014-11-22 MED ORDER — ONDANSETRON HCL 4 MG/2ML IJ SOLN
4.0000 mg | Freq: Four times a day (QID) | INTRAMUSCULAR | Status: DC | PRN
  Administered 2014-11-27: 4 mg via INTRAVENOUS
  Filled 2014-11-22: qty 2

## 2014-11-22 MED ORDER — METOCLOPRAMIDE HCL 5 MG PO TABS: 5.0000 mg | ORAL_TABLET | Freq: Three times a day (TID) | ORAL | Status: DC | PRN

## 2014-11-22 MED ORDER — METOPROLOL TARTRATE 25 MG PO TABS
25.0000 mg | ORAL_TABLET | Freq: Two times a day (BID) | ORAL | Status: DC
  Administered 2014-11-22 – 2014-11-28 (×12): 25 mg via ORAL
  Filled 2014-11-22 (×13): qty 1

## 2014-11-22 MED ORDER — MIDAZOLAM HCL 2 MG/2ML IJ SOLN
INTRAMUSCULAR | Status: AC
  Filled 2014-11-22: qty 4

## 2014-11-22 MED ORDER — LIDOCAINE-EPINEPHRINE 1 %-1:100000 IJ SOLN
10.0000 mL | Freq: Once | INTRAMUSCULAR | Status: DC
  Filled 2014-11-22: qty 10

## 2014-11-22 MED ORDER — MIDAZOLAM HCL 5 MG/5ML IJ SOLN
INTRAMUSCULAR | Status: DC | PRN
  Administered 2014-11-22 (×2): 1 mg via INTRAVENOUS

## 2014-11-22 MED ORDER — PROPOFOL 10 MG/ML IV BOLUS
INTRAVENOUS | Status: DC | PRN
  Administered 2014-11-22: 160 mg via INTRAVENOUS
  Administered 2014-11-22: 20 mg via INTRAVENOUS

## 2014-11-22 MED ORDER — HYDROMORPHONE HCL 1 MG/ML IJ SOLN
INTRAMUSCULAR | Status: AC
  Filled 2014-11-22: qty 1

## 2014-11-22 MED ORDER — LIDOCAINE HCL (CARDIAC) 20 MG/ML IV SOLN
INTRAVENOUS | Status: DC | PRN
  Administered 2014-11-22: 50 mg via INTRAVENOUS

## 2014-11-22 MED ORDER — ACETAMINOPHEN 325 MG PO TABS
650.0000 mg | ORAL_TABLET | Freq: Four times a day (QID) | ORAL | Status: DC | PRN
  Administered 2014-11-26: 650 mg via ORAL
  Filled 2014-11-22 (×2): qty 2

## 2014-11-22 MED ORDER — FENTANYL CITRATE (PF) 250 MCG/5ML IJ SOLN
INTRAMUSCULAR | Status: AC
  Filled 2014-11-22: qty 5

## 2014-11-22 MED ORDER — DOCUSATE SODIUM 100 MG PO CAPS
100.0000 mg | ORAL_CAPSULE | Freq: Two times a day (BID) | ORAL | Status: DC
  Administered 2014-11-22 – 2014-11-28 (×13): 100 mg via ORAL
  Filled 2014-11-22 (×13): qty 1

## 2014-11-22 MED ORDER — FENTANYL CITRATE (PF) 250 MCG/5ML IJ SOLN
INTRAMUSCULAR | Status: DC | PRN
  Administered 2014-11-22 (×3): 50 ug via INTRAVENOUS

## 2014-11-22 MED ORDER — HYDROMORPHONE HCL 1 MG/ML IJ SOLN
0.2500 mg | INTRAMUSCULAR | Status: DC | PRN
  Administered 2014-11-22 (×2): 0.5 mg via INTRAVENOUS

## 2014-11-22 MED ORDER — HYDROMORPHONE 0.3 MG/ML IV SOLN
INTRAVENOUS | Status: DC
  Administered 2014-11-22: 23:00:00 via INTRAVENOUS
  Administered 2014-11-22: 3 mg via INTRAVENOUS
  Administered 2014-11-22: 2.9 mg via INTRAVENOUS
  Administered 2014-11-22 (×2): via INTRAVENOUS
  Administered 2014-11-22: 4.18 mg via INTRAVENOUS
  Administered 2014-11-23: 17:00:00 via INTRAVENOUS
  Administered 2014-11-23: 3 mg via INTRAVENOUS
  Administered 2014-11-23: 5.9 mg via INTRAVENOUS
  Administered 2014-11-23: 08:00:00 via INTRAVENOUS
  Administered 2014-11-23: 13 mL via INTRAVENOUS
  Administered 2014-11-23: 10 mL via INTRAVENOUS
  Administered 2014-11-24: 3.6 mg via INTRAVENOUS
  Administered 2014-11-24: 3.9 mg via INTRAVENOUS
  Administered 2014-11-24: 13:00:00 via INTRAVENOUS
  Administered 2014-11-24: 0.3 mg via INTRAVENOUS
  Administered 2014-11-24: 1.8 mg via INTRAVENOUS
  Administered 2014-11-24: 01:00:00 via INTRAVENOUS
  Administered 2014-11-25: 3 mg via INTRAVENOUS
  Administered 2014-11-25: 0.3 mg via INTRAVENOUS
  Administered 2014-11-25: 2.1 mg via INTRAVENOUS
  Administered 2014-11-25: 0.3 mL via INTRAVENOUS
  Administered 2014-11-25: 3 mg via INTRAVENOUS
  Administered 2014-11-25: 10:00:00 via INTRAVENOUS
  Administered 2014-11-26: 2.1 mg via INTRAVENOUS
  Administered 2014-11-26: 3 mL via INTRAVENOUS
  Administered 2014-11-26: 14 mL via INTRAVENOUS
  Administered 2014-11-26: 06:00:00 via INTRAVENOUS
  Filled 2014-11-22 (×11): qty 25

## 2014-11-22 MED ORDER — HYDROMORPHONE HCL 1 MG/ML IJ SOLN
0.2500 mg | INTRAMUSCULAR | Status: DC | PRN
  Administered 2014-11-22: 1 mg via INTRAVENOUS
  Filled 2014-11-22: qty 1

## 2014-11-22 MED ORDER — SODIUM CHLORIDE 0.9 % IJ SOLN: 9.0000 mL | INTRAMUSCULAR | Status: DC | PRN

## 2014-11-22 MED ORDER — CEFAZOLIN SODIUM-DEXTROSE 2-3 GM-% IV SOLR
2.0000 g | Freq: Four times a day (QID) | INTRAVENOUS | Status: AC
  Administered 2014-11-22 (×3): 2 g via INTRAVENOUS
  Filled 2014-11-22 (×3): qty 50

## 2014-11-22 MED ORDER — DIPHENHYDRAMINE HCL 50 MG/ML IJ SOLN: 12.5000 mg | Freq: Four times a day (QID) | INTRAMUSCULAR | Status: DC | PRN

## 2014-11-22 MED ORDER — DIPHENHYDRAMINE HCL 12.5 MG/5ML PO ELIX: 12.5000 mg | ORAL_SOLUTION | Freq: Four times a day (QID) | ORAL | Status: DC | PRN

## 2014-11-22 MED ORDER — NALOXONE HCL 0.4 MG/ML IJ SOLN: 0.4000 mg | INTRAMUSCULAR | Status: DC | PRN

## 2014-11-22 MED ORDER — METOCLOPRAMIDE HCL 5 MG/ML IJ SOLN: 5.0000 mg | Freq: Three times a day (TID) | INTRAMUSCULAR | Status: DC | PRN

## 2014-11-22 MED ORDER — CEFAZOLIN SODIUM-DEXTROSE 2-3 GM-% IV SOLR
INTRAVENOUS | Status: DC | PRN
  Administered 2014-11-22: 2 g via INTRAVENOUS

## 2014-11-22 MED ORDER — CETYLPYRIDINIUM CHLORIDE 0.05 % MT LIQD
7.0000 mL | Freq: Two times a day (BID) | OROMUCOSAL | Status: DC
  Administered 2014-11-22 – 2014-11-28 (×7): 7 mL via OROMUCOSAL

## 2014-11-22 MED ORDER — ONDANSETRON HCL 4 MG PO TABS: 4.0000 mg | ORAL_TABLET | Freq: Four times a day (QID) | ORAL | Status: DC | PRN

## 2014-11-22 MED ORDER — LACTATED RINGERS IV SOLN
INTRAVENOUS | Status: DC | PRN
  Administered 2014-11-22 (×2): via INTRAVENOUS

## 2014-11-22 MED ORDER — PROPOFOL 10 MG/ML IV BOLUS
INTRAVENOUS | Status: AC
  Filled 2014-11-22: qty 20

## 2014-11-22 MED ORDER — POLYETHYLENE GLYCOL 3350 17 G PO PACK
17.0000 g | PACK | Freq: Every day | ORAL | Status: DC
  Administered 2014-11-22 – 2014-11-28 (×7): 17 g via ORAL
  Filled 2014-11-22 (×7): qty 1

## 2014-11-22 SURGICAL SUPPLY — 59 items
BANDAGE ELASTIC 4 VELCRO ST LF (GAUZE/BANDAGES/DRESSINGS) IMPLANT
BANDAGE ELASTIC 6 VELCRO ST LF (GAUZE/BANDAGES/DRESSINGS) IMPLANT
BANDAGE ESMARK 6X9 LF (GAUZE/BANDAGES/DRESSINGS) ×1 IMPLANT
BLADE SURG 10 STRL SS (BLADE) ×3 IMPLANT
BLADE SURG 15 STRL LF DISP TIS (BLADE) ×1 IMPLANT
BLADE SURG 15 STRL SS (BLADE) ×3
BNDG CMPR 9X6 STRL LF SNTH (GAUZE/BANDAGES/DRESSINGS) ×1
BNDG COHESIVE 4X5 TAN STRL (GAUZE/BANDAGES/DRESSINGS) ×3 IMPLANT
BNDG ESMARK 6X9 LF (GAUZE/BANDAGES/DRESSINGS) ×3
BNDG GAUZE ELAST 4 BULKY (GAUZE/BANDAGES/DRESSINGS) ×3 IMPLANT
COVER MAYO STAND STRL (DRAPES) ×3 IMPLANT
COVER SURGICAL LIGHT HANDLE (MISCELLANEOUS) ×3 IMPLANT
CUFF TOURNIQUET SINGLE 34IN LL (TOURNIQUET CUFF) IMPLANT
DRAPE C-ARM 42X72 X-RAY (DRAPES) ×3 IMPLANT
DRAPE C-ARMOR (DRAPES) IMPLANT
DRAPE IMP U-DRAPE 54X76 (DRAPES) ×6 IMPLANT
DRAPE INCISE IOBAN 66X45 STRL (DRAPES) ×3 IMPLANT
DRAPE U-SHAPE 47X51 STRL (DRAPES) ×3 IMPLANT
DRSG PAD ABDOMINAL 8X10 ST (GAUZE/BANDAGES/DRESSINGS) ×12 IMPLANT
ELECT REM PT RETURN 9FT ADLT (ELECTROSURGICAL) ×3
ELECTRODE REM PT RTRN 9FT ADLT (ELECTROSURGICAL) ×1 IMPLANT
GAUZE SPONGE 4X4 12PLY STRL (GAUZE/BANDAGES/DRESSINGS) ×3 IMPLANT
GLOVE BIO SURGEON STRL SZ7 (GLOVE) ×3 IMPLANT
GLOVE BIO SURGEON STRL SZ7.5 (GLOVE) ×3 IMPLANT
GLOVE BIOGEL PI IND STRL 7.0 (GLOVE) ×1 IMPLANT
GLOVE BIOGEL PI IND STRL 8 (GLOVE) ×1 IMPLANT
GLOVE BIOGEL PI INDICATOR 7.0 (GLOVE) ×2
GLOVE BIOGEL PI INDICATOR 8 (GLOVE) ×2
GOWN STRL REUS W/ TWL LRG LVL3 (GOWN DISPOSABLE) ×2 IMPLANT
GOWN STRL REUS W/TWL LRG LVL3 (GOWN DISPOSABLE) ×6
IMMOBILIZER KNEE 22 UNIV (SOFTGOODS) IMPLANT
KIT BASIN OR (CUSTOM PROCEDURE TRAY) ×3 IMPLANT
KIT ROOM TURNOVER OR (KITS) ×3 IMPLANT
MANIFOLD NEPTUNE II (INSTRUMENTS) ×3 IMPLANT
NDL SUT 6 .5 CRC .975X.05 MAYO (NEEDLE) IMPLANT
NEEDLE MAYO TAPER (NEEDLE)
NS IRRIG 1000ML POUR BTL (IV SOLUTION) ×3 IMPLANT
PACK ORTHO EXTREMITY (CUSTOM PROCEDURE TRAY) ×3 IMPLANT
PAD ARMBOARD 7.5X6 YLW CONV (MISCELLANEOUS) ×6 IMPLANT
SPONGE GAUZE 4X4 12PLY STER LF (GAUZE/BANDAGES/DRESSINGS) ×2 IMPLANT
SPONGE LAP 18X18 X RAY DECT (DISPOSABLE) ×3 IMPLANT
STOCKINETTE IMPERVIOUS LG (DRAPES) ×3 IMPLANT
SUCTION FRAZIER TIP 10 FR DISP (SUCTIONS) ×3 IMPLANT
SUT FIBERWIRE #2 38 T-5 BLUE (SUTURE)
SUT FIBERWIRE 2-0 18 17.9 3/8 (SUTURE) ×3
SUT MNCRL AB 4-0 PS2 18 (SUTURE) IMPLANT
SUT MON AB 2-0 CT1 36 (SUTURE) ×3 IMPLANT
SUT VIC AB 0 CT1 27 (SUTURE) ×6
SUT VIC AB 0 CT1 27XBRD ANBCTR (SUTURE) ×2 IMPLANT
SUTURE FIBERWR #2 38 T-5 BLUE (SUTURE) IMPLANT
SUTURE FIBERWR 2-0 18 17.9 3/8 (SUTURE) ×1 IMPLANT
TOWEL OR 17X24 6PK STRL BLUE (TOWEL DISPOSABLE) ×3 IMPLANT
TOWEL OR 17X26 10 PK STRL BLUE (TOWEL DISPOSABLE) ×6 IMPLANT
TOWEL OR NON WOVEN STRL DISP B (DISPOSABLE) ×3 IMPLANT
TRAY FOLEY CATH 14FR (SET/KITS/TRAYS/PACK) IMPLANT
TUBE CONNECTING 12'X1/4 (SUCTIONS) ×1
TUBE CONNECTING 12X1/4 (SUCTIONS) ×2 IMPLANT
WATER STERILE IRR 1000ML POUR (IV SOLUTION) ×6 IMPLANT
YANKAUER SUCT BULB TIP NO VENT (SUCTIONS) ×3 IMPLANT

## 2014-11-22 NOTE — Progress Notes (Signed)
Patient ID: Paul Galloway, male   DOB: October 23, 1970, 44 y.o.   MRN: [id number]   LOS: 0 days   Subjective: In severe pain at expected locations. Feels like he can't take a deep breath. Anterior portion of c-collar causing sternal pain.   Objective: Vital signs in last 24 hours: Temp:  [98 F (36.7 C)-98.6 F (37 C)] 98.2 F (36.8 C) (08/04 0812) Pulse Rate:  [64-94] 80 (08/04 0812) Resp:  [11-27] 20 (08/04 0812) BP: (108-156)/(65-128) 148/80 mmHg (08/04 0812) SpO2:  [95 %-100 %] 97 % (08/04 0812) Weight:  [86.183 kg (190 lb)] 86.183 kg (190 lb) (08/03 2202) Last BM Date: 11/20/14   Physical Exam General appearance: alert and mild distress Resp: clear to auscultation bilaterally Cardio: regular rate and rhythm GI: normal findings: bowel sounds normal and soft, non-tender Extremities: NVI   Assessment/Plan: [facility] C2 fx -- Collar per Dr. Franky Macho Lip laceration -- Repair today Sternal fx -- Pulmonary toilet Right hip fx/dislocation s/p skeletal traction -- per Dr. Eulah Pont HTN -- Home meds FEN -- Try PCA for pain VTE -- SCD's, Lovenox Dispo -- OR for hip at some point    Freeman Caldron, PA-C Pager: [phone] General Trauma PA Pager: [phone]  11/22/2014

## 2014-11-22 NOTE — Anesthesia Postprocedure Evaluation (Signed)
  Anesthesia Post-op Note  Patient: Paul Galloway  Procedure(s) Performed: Procedure(s): CLOSED REDUCTION RIGHT HIP, PLACEMENT TRACTION PIN (Right)  Patient Location: PACU  Anesthesia Type:General  Level of Consciousness: awake and sedated  Airway and Oxygen Therapy: Patient Spontanous Breathing  Post-op Pain: mild  Post-op Assessment: Post-op Vital signs reviewed              Post-op Vital Signs: stable  Last Vitals:  Filed Vitals:   11/22/14 0324  BP:   Pulse:   Temp: 36.8 C  Resp:     Complications: No apparent anesthesia complications

## 2014-11-22 NOTE — Progress Notes (Signed)
Paging on-call MD again for pain medication for pt. Will continue to monitor and wait for orders.

## 2014-11-22 NOTE — ED Notes (Signed)
Call from OR. Pt to go to short stay 36

## 2014-11-22 NOTE — Progress Notes (Signed)
Pt was given both Dilaudid  IV and Percocet 5/325mg  2 tabs at 0630. This was discussed with the charge nurse and was decided that pt is in severe pain and both should be given now. We will space them out as soon as we get his pain back under managed control. Pt does complain of 10/10 generalized pain. Pt has been noted to be tearful and yelling out at times in pain. Will continue to monitor and work towards getting his pain under control.

## 2014-11-22 NOTE — Procedures (Signed)
Procedure: Simple repair upper lip lac ~1cm  Indication: Lip laceration  Surgeon: Charma Igo, PA-C  Assist: None  Anesthesia: 1ml lidocaine w/epi 1%  EBL: None  Complications: None  Findings: Pt had upper lip lac crossing the vermillion border that was not able to be repaired in the ED. After adequate anesthesia was infiltrated and confirmed the area was scrubbed with an iodine swab to remove dried blood and other debris. It was then approximated using 3 6-0 interrupted prolene sutures. Patient tolerated the procedure well.    Paul Caldron, PA-C Pager: [phone] General Trauma PA Pager: [phone]

## 2014-11-22 NOTE — Transfer of Care (Signed)
Immediate Anesthesia Transfer of Care Note  Patient: Paul Galloway  Procedure(s) Performed: Procedure(s): CLOSED REDUCTION RIGHT HIP, PLACEMENT TRACTION PIN (Right)  Patient Location: PACU  Anesthesia Type:General  Level of Consciousness: awake  Airway & Oxygen Therapy: Patient Spontanous Breathing and Patient connected to face mask oxygen  Post-op Assessment: Report given to RN and Post -op Vital signs reviewed and stable  Post vital signs: Reviewed and stable  Last Vitals:  Filed Vitals:   11/22/14 0115  BP: 156/128  Pulse: 73  Temp:   Resp: 18    Complications: No apparent anesthesia complications
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
## 2014-11-22 NOTE — Progress Notes (Signed)
Pt is in severe pain and reports it to be a 10/10 generalized. There are no pain prn orders at this time. MD was paged. Will wait for response.
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
## 2014-11-22 NOTE — Progress Notes (Signed)
Pt's wife was called to advise where he is per his request. Message was left on answering machine
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
## 2014-11-22 NOTE — Op Note (Signed)
11/21/2014 - 11/22/2014  2:27 AM  PATIENT:  Paul Galloway    PRE-OPERATIVE DIAGNOSIS:  right hip dislocation  POST-OPERATIVE DIAGNOSIS:  Same  PROCEDURE:  CLOSED REDUCTION RIGHT HIP, PLACEMENT TRACTION PIN  SURGEON:  Yanelis Osika D, MD  ASSISTANT: none  ANESTHESIA:   gen  PREOPERATIVE INDICATIONS:  [name] is a  44 y.o. male with a diagnosis of right hip dislocation who failed conservative measures and elected for surgical management.    The risks benefits and alternatives were discussed with the patient preoperatively including but not limited to the risks of infection, bleeding, nerve injury, cardiopulmonary complications, the need for revision surgery, among others, and the patient was willing to proceed.  OPERATIVE IMPLANTS: skeletal traction pin  OPERATIVE FINDINGS: concentric reduction  BLOOD LOSS: min  COMPLICATIONS: none  TOURNIQUET TIME: none  OPERATIVE PROCEDURE:  Patient was identified in the preoperative holding area and site was marked by me He was transported to the operating theater and placed on the table in supine position taking care to pad all bony prominences. After a preincinduction time out anesthesia was induced. The right lower extremity was prepped and draped in normal sterile fashion and a pre-incision timeout was performed. He received ancef for preoperative antibiotics.   However prior to prepping and draping after sedation induction and paralysis were given a performed a gentle but firm reduction of his right hip joint. I took AP and lateral x-rays to confirm concentric reduction.  After examining the x-rays given the multiple fragmentation of the femoral head I did elect to place a traction pin to protect the articular surfaces until we could get a CAT scan confirming there is no loose body that would risk the cartilage.  I then prepped and draped his right lower extremity in the normal sterile fashion. I made a medial sided neck and the  skin felt his abductor tubercle tubercle went proximal to that so be out of the knee joint and placed a smooth K wire from medial to lateral. I then placed a traction bow I padded his knees shows the bow would not rest on the scan.  He was then awoken after sterile dressing was applied. He was noted to his bed where 15 pounds of traction was hung.  POST OPERATIVE PLAN: CT to eval reduction and for loose bodies, chemical dvt px. TDWB RLE once mobilized    This note was generated using a template and dragon dictation system. In light of that, I have reviewed the note and all aspects of it are applicable to this case. Any dictation errors are due to the computerized dictation system.

## 2014-11-22 NOTE — Progress Notes (Signed)
PT Cancellation Note  Patient Details Name: Paul Galloway MRN: [id number] DOB: 1970-07-19   Cancelled Treatment:    Reason Eval/Treat Not Completed: Patient not medically ready. PT order received and acknowledged. Pt still with traction to bed and awaiting CT of hip to determine course of treatment. Eval held at this time and discussed with RN. Please re-order PT when pt appropriate to start to mobilize. Thank you.    Karolee Stamps Darrol Poke, PT, DPT Pager #: [phone]  11/22/2014, 10:32 AM
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
## 2014-11-22 NOTE — Care Management Utilization Note (Signed)
Utilization review completed. Norrine Ballester, RN Case Manager [phone]. 

## 2014-11-22 NOTE — Anesthesia Procedure Notes (Signed)
Procedure Name: Intubation Date/Time: 11/22/2014 2:00 AM Performed by: Brien Mates D Pre-anesthesia Checklist: Patient identified, Emergency Drugs available, Suction available, Patient being monitored and Timeout performed Patient Re-evaluated:Patient Re-evaluated prior to inductionOxygen Delivery Method: Circle system utilized Preoxygenation: Pre-oxygenation with 100% oxygen (Front collar removed with inline traction held by MDA to keep head/neck neutral alignment.) Intubation Type: IV induction, Rapid sequence and Cricoid Pressure applied Laryngoscope Size: Glidescope (Elective) Grade View: Grade I Tube type: Oral Tube size: 7.5 mm Number of attempts: 1 Airway Equipment and Method: Video-laryngoscopy and Stylet (head kept neutral alignment throughout DL.  Front of collar immediatley replaced after intubation) Placement Confirmation: ETT inserted through vocal cords under direct vision,  positive ETCO2 and breath sounds checked- equal and bilateral Secured at: 23 cm Tube secured with: Tape Dental Injury: Teeth and Oropharynx as per pre-operative assessment

## 2014-11-22 NOTE — ED Notes (Signed)
Pt going to OR

## 2014-11-23 ENCOUNTER — Encounter (HOSPITAL_COMMUNITY): Payer: Self-pay | Admitting: Orthopedic Surgery

## 2014-11-23 DIAGNOSIS — S129XXA Fracture of neck, unspecified, initial encounter: Secondary | ICD-10-CM | POA: Diagnosis present

## 2014-11-23 LAB — CBC
HCT: 40.6 % (ref 39.0–52.0)
Hemoglobin: 13.5 g/dL (ref 13.0–17.0)
MCH: 32.5 pg (ref 26.0–34.0)
MCHC: 33.3 g/dL (ref 30.0–36.0)
MCV: 97.6 fL (ref 78.0–100.0)
PLATELETS: 150 10*3/uL (ref 150–400)
RBC: 4.16 MIL/uL — ABNORMAL LOW (ref 4.22–5.81)
RDW: 14.4 % (ref 11.5–15.5)
WBC: 9.3 10*3/uL (ref 4.0–10.5)

## 2014-11-23 MED ORDER — OXYCODONE HCL 5 MG PO TABS
10.0000 mg | ORAL_TABLET | ORAL | Status: DC | PRN
  Administered 2014-11-26 (×3): 15 mg via ORAL
  Administered 2014-11-26: 10 mg via ORAL
  Administered 2014-11-27 (×2): 15 mg via ORAL
  Filled 2014-11-23 (×5): qty 3
  Filled 2014-11-23: qty 2

## 2014-11-23 NOTE — Progress Notes (Signed)
OT Cancellation Note  Patient Details Name: Paul Galloway MRN: [id number] DOB: 05-01-1970   Cancelled Treatment:  Reason Eval/Treat Not Completed:Pt not medically appropriate for OT intervention at this time. Pt remains in bed with traction. OT evaluation acknowledged and held at time time. OT discussed with RN. Please reorder OT when pt able to participate. Thank you for referral.   Lowella Grip 11/23/2014, 10:32 AM

## 2014-11-23 NOTE — Progress Notes (Signed)
LCSW met with patient girlfriend in room.  Patient limited by pain in room, but did complete SBIRT. Did not need any interventions at this time per his report. Patient wanting to get better medically and appears to have positive support at the bedside.  SW signing off at this time. If needs arise, please contact.  Lane Hacker, MSW Clinical Social Work: Emergency Room [phone]

## 2014-11-23 NOTE — Progress Notes (Signed)
Patient ID: Paul Galloway, male   DOB: 1971/03/09, 44 y.o.   MRN: [id number] BP 137/74 mmHg  Pulse 79  Temp(Src) 98.2 F (36.8 C) (Oral)  Resp 17  Ht  (1.803 m)  Wt 86.183 kg (190 lb)  BMI 26.51 kg/m2  SpO2 96% Alert and oriented x 4, speech is clear, fluent Brace on. Plan is to remain immobilized, no operative intervention at this time.
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
## 2014-11-23 NOTE — Progress Notes (Signed)
PT Cancellation Note  Patient Details Name: Paul Galloway MRN: [id number] DOB: 07/21/1970   Cancelled Treatment:    Reason Eval/Treat Not Completed: Pain limiting ability to participate RN reports patient is in an immense amount of pain since traction being removed today. [name] requests PT be held until tomorrow. Family states they are awaiting vendor for cervical collar. Will follow up for evaluation tomorrow.  Berton Mount 11/23/2014, 5:31 PM [facility] [place], [place] [phone]
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
## 2014-11-24 NOTE — Progress Notes (Signed)
Subjective: 2 Days Post-Op Procedure(s) (LRB): CLOSED REDUCTION RIGHT HIP, PLACEMENT TRACTION PIN (Right) Patient reports pain as moderate.    Objective: Vital signs in last 24 hours: Temp:  [98.2 F (36.8 C)-98.8 F (37.1 C)] 98.5 F (36.9 C) (08/06 0545) Pulse Rate:  [76-79] 76 (08/06 0545) Resp:  [8-19] 10 (08/06 0919) BP: (116-137)/(54-77) 116/54 mmHg (08/06 0545) SpO2:  [95 %-98 %] 98 % (08/06 0919)  Intake/Output from previous day: 08/05 0701 - 08/06 0700 In: 240 [P.O.:240] Out: 850 [Urine:850] Intake/Output this shift:     Recent Labs  11/21/14 2310 11/21/14 2318 11/23/14 0439  HGB 15.8 17.0 13.5    Recent Labs  11/21/14 2310 11/21/14 2318 11/23/14 0439  WBC 12.6*  --  9.3  RBC 4.74  --  4.16*  HCT 45.7 50.0 40.6  PLT 195  --  150    Recent Labs  11/21/14 2310 11/21/14 2318  NA 139 140  K 3.6 3.5  CL 107 105  CO2 19*  --   BUN 21* 25*  CREATININE 0.93 1.10  GLUCOSE 111* 110*  CALCIUM 8.6*  --     Recent Labs  11/21/14 2310  INR 1.04    Neurovascular intact Sensation intact distally Intact pulses distally Dorsiflexion/Plantar flexion intact Compartment soft  Assessment/Plan: 2 Days Post-Op Procedure(s) (LRB): CLOSED REDUCTION RIGHT HIP, PLACEMENT TRACTION PIN (Right) Up with therapy  TTWB RLE   Paul Galloway 11/24/2014, 10:00 AM
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
## 2014-11-24 NOTE — Progress Notes (Signed)
Patient does not want foley catheter removed. Patient states that it is needed for comfort care.

## 2014-11-25 NOTE — Progress Notes (Signed)
Subjective: 3 Days Post-Op Procedure(s) (LRB): CLOSED REDUCTION RIGHT HIP, PLACEMENT TRACTION PIN (Right) Patient reports pain as moderate.  C/o more neck and sternal pain than anything.   Objective: Vital signs in last 24 hours: Temp:  [98.1 F (36.7 C)-98.9 F (37.2 C)] 98.1 F (36.7 C) (08/07 0636) Pulse Rate:  [69-78] 69 (08/07 0636) Resp:  [14-20] 20 (08/07 0955) BP: (128-148)/(62-90) 148/90 mmHg (08/07 0636) SpO2:  [96 %-100 %] 98 % (08/07 0955)  Intake/Output from previous day: 08/06 0701 - 08/07 0700 In: 840 [P.O.:840] Out: 1225 [Urine:1225] Intake/Output this shift: Total I/O In: 120 [P.O.:120] Out: 150 [Urine:150]   Recent Labs  11/23/14 0439  HGB 13.5    Recent Labs  11/23/14 0439  WBC 9.3  RBC 4.16*  HCT 40.6  PLT 150   No results for input(s): NA, K, CL, CO2, BUN, CREATININE, GLUCOSE, CALCIUM in the last 72 hours. No results for input(s): LABPT, INR in the last 72 hours.  Neurovascular intact Sensation intact distally Intact pulses distally Dorsiflexion/Plantar flexion intact Compartment soft  Assessment/Plan: 3 Days Post-Op Procedure(s) (LRB): CLOSED REDUCTION RIGHT HIP, PLACEMENT TRACTION PIN (Right) Up with therapy  TTWB RLE   Margart Sickles 11/25/2014, 12:39 PM
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
## 2014-11-25 NOTE — Progress Notes (Signed)
Pt c/o of discomfort from the neck brace and proceeded to take the neck collar portion loose.  [employer], [employer]embert, contated and proceeded to come assess the brace position.  Rembert, [employer], contacted [employer] and informed them of patient situation.  [employer] is to come and assess and reposition the neck brace.  Instructed pt not to get OOB or move his neck while in the meantime.  Pt verbalized understanding.  Will continue to monitor.

## 2014-11-25 NOTE — Progress Notes (Signed)
3 Days Post-Op  Subjective: Complains of pain in neck  Objective: Vital signs in last 24 hours: Temp:  [98.1 F (36.7 C)-98.9 F (37.2 C)] 98.1 F (36.7 C) (08/07 0636) Pulse Rate:  [69-78] 69 (08/07 0636) Resp:  [14-20] 20 (08/07 0955) BP: (128-148)/(62-90) 148/90 mmHg (08/07 0636) SpO2:  [96 %-100 %] 98 % (08/07 0955) Last BM Date: 11/21/14 (PTA)  Intake/Output from previous day: 08/06 0701 - 08/07 0700 In: 840 [P.O.:840] Out: 1225 [Urine:1225] Intake/Output this shift: Total I/O In: 120 [P.O.:120] Out: 150 [Urine:150]  Resp: clear to auscultation bilaterally Cardio: regular rate and rhythm GI: soft, non-tender; bowel sounds normal; no masses,  no organomegaly Extremities: moves all 4 extr to command  Lab Results:   Recent Labs  11/23/14 0439  WBC 9.3  HGB 13.5  HCT 40.6  PLT 150   BMET No results for input(s): NA, K, CL, CO2, GLUCOSE, BUN, CREATININE, CALCIUM in the last 72 hours. PT/INR No results for input(s): LABPROT, INR in the last 72 hours. ABG No results for input(s): PHART, HCO3 in the last 72 hours.  Invalid input(s): PCO2, PO2  Studies/Results: No results found.  Anti-infectives: Anti-infectives    Start     Dose/Rate Route Frequency Ordered Stop   11/22/14 0800  ceFAZolin (ANCEF) IVPB 2 g/50 mL premix     2 g 100 mL/hr over 30 Minutes Intravenous Every 6 hours 11/22/14 0338 11/22/14 2046      Assessment/Plan: s/p Procedure(s): CLOSED REDUCTION RIGHT HIP, PLACEMENT TRACTION PIN (Right) C2 fx. in c collar. per NSU  R femoral head fx and acetabular fx. Touch down weight bearing on R. Per Ortho.  PT  LOS: 3 days    TOTH III,PAUL S 11/25/2014

## 2014-11-25 NOTE — Progress Notes (Signed)
Patient requested that foley remain in place one more day for comfort measures.  Dr. Carolynne Edouard notified of request and gave permission for foley to remain in place for another day for comfort measures.

## 2014-11-26 DIAGNOSIS — S72001A Fracture of unspecified part of neck of right femur, initial encounter for closed fracture: Secondary | ICD-10-CM

## 2014-11-26 DIAGNOSIS — S12100A Unspecified displaced fracture of second cervical vertebra, initial encounter for closed fracture: Principal | ICD-10-CM

## 2014-11-26 DIAGNOSIS — S129XXA Fracture of neck, unspecified, initial encounter: Secondary | ICD-10-CM

## 2014-11-26 DIAGNOSIS — S27322A Contusion of lung, bilateral, initial encounter: Secondary | ICD-10-CM

## 2014-11-26 MED ORDER — HYDROMORPHONE HCL 1 MG/ML IJ SOLN
0.5000 mg | INTRAMUSCULAR | Status: DC | PRN
Start: 2014-11-26 — End: 2014-11-27
  Administered 2014-11-26 – 2014-11-27 (×5): 1 mg via INTRAVENOUS
  Filled 2014-11-26 (×5): qty 1

## 2014-11-26 MED ORDER — LORATADINE 10 MG PO TABS
10.0000 mg | ORAL_TABLET | Freq: Every day | ORAL | Status: DC
  Administered 2014-11-26 – 2014-11-28 (×3): 10 mg via ORAL
  Filled 2014-11-26 (×3): qty 1

## 2014-11-26 MED ORDER — ACETAMINOPHEN 500 MG PO TABS
1000.0000 mg | ORAL_TABLET | Freq: Three times a day (TID) | ORAL | Status: DC
Start: 2014-11-26 — End: 2014-11-28
  Administered 2014-11-26 – 2014-11-28 (×7): 1000 mg via ORAL
  Filled 2014-11-26 (×7): qty 2
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
## 2014-11-26 NOTE — Progress Notes (Signed)
Continue TDWB, posterior hip precautions.   Follow up with me in clinic in 1-2wks for repeat x-ray  I will sign off at this time.     Jacky Hartung D

## 2014-11-26 NOTE — Progress Notes (Signed)
Patient takes off Aspen collar frequently. RN educated patient of risks associated with removing Aspen collar and the importance of complying with the Md's order to keep the Aspen collar on at all times. RN and NT helped to reposition the patient throughout the night. Patient thanked Charity fundraiser. RN encouraged patient to use Dilaudid PCA to help remedy the pain. Nursing will continue to monitor.

## 2014-11-26 NOTE — Progress Notes (Signed)
Received prescreen request for inpatient rehab and noted that rehab consult has already been ordered. An admission coordinator will follow up at that time. Thanks.  Juliann Mule, PT Rehabilitation Admissions Coordinator [phone]

## 2014-11-26 NOTE — Progress Notes (Signed)
Patient ID: Paul Galloway, male   DOB: [birth year], 44 y.o.   MRN: [id number] BP 139/73 mmHg  Pulse 53  Temp(Src) 98 F (36.7 C) (Oral)  Resp 18  Ht  (1.803 m)  Wt 86.183 kg (190 lb)  BMI 26.51 kg/m2  SpO2 98% Alert and oriented x 4 Speech is clear and fluent Moving all extremities well Reiterated the importance of wearing the collar properly.
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
## 2014-11-26 NOTE — Progress Notes (Signed)
Patient remained non-compliant throughout the night and continued to take off his Aspen collar brace when he wanted to despite education. RN and NT went into the patients room several times throughout the night to logroll the patient in attempts to reposition the patient for comfort measures. Patients pain well managed with Dilaudid PCA pump. Nursing will continue to monitor.

## 2014-11-27 MED ORDER — HYDROMORPHONE HCL 1 MG/ML IJ SOLN
0.5000 mg | INTRAMUSCULAR | Status: DC | PRN
  Administered 2014-11-27 – 2014-11-28 (×7): 0.5 mg via INTRAVENOUS
  Filled 2014-11-27 (×7): qty 1

## 2014-11-27 MED ORDER — OXYCODONE HCL 5 MG PO TABS
10.0000 mg | ORAL_TABLET | ORAL | Status: DC | PRN
  Administered 2014-11-27 – 2014-11-28 (×8): 20 mg via ORAL
  Filled 2014-11-27 (×8): qty 4

## 2014-11-27 MED ORDER — METHOCARBAMOL 750 MG PO TABS
750.0000 mg | ORAL_TABLET | Freq: Three times a day (TID) | ORAL | Status: DC
  Administered 2014-11-27 – 2014-11-28 (×5): 750 mg via ORAL
  Filled 2014-11-27 (×5): qty 1
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
## 2014-11-27 NOTE — Progress Notes (Signed)
Patient tugging on cervical collar and asking this nurse to loosen it up.Explained to patient that collar is in place for his cervical fracture and that I could not readjust the alignment .Asked patient to stop pulling on collar .Patient verbalized understanding.Patient still moving extremities x 4.Will continue to monitor patient.
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
## 2014-11-27 NOTE — Progress Notes (Signed)
Rehab admissions - I met with pt in follow up to rehab MD consult and noted that rehab MD would like to see pt's activity tolerance increase as well as managing pain on oral medications. Pt was transitioned to oral pain meds this am and did receive pain shot earlier today as well. Further details were given about our rehab program and informational brochures were given. Pt is interested in pursuing inpatient rehab.  Pt asked that I contact his girlfriend to share these updates. I spoke with Jimmey Ralph and she is also in support of pt coming to CIR. I explained to both pt/girlfriend that we have limited rehab beds and would monitor pt's status and our bed availability daily. I explained that skilled nursing may need to be considered as well. Both were open to this as well.  I will check on pt's status tomorrow. Thanks.  Nanetta Batty, PT Rehabilitation Admissions Coordinator [phone]

## 2014-11-28 ENCOUNTER — Encounter (HOSPITAL_COMMUNITY): Payer: Self-pay | Admitting: Physical Medicine and Rehabilitation

## 2014-11-28 ENCOUNTER — Inpatient Hospital Stay (HOSPITAL_COMMUNITY)
Admission: RE | Admit: 2014-11-28 | Discharge: 2014-12-12 | DRG: 560 | Disposition: A | Discharge status: Home Health | Payer: Medicaid Other | Source: Intra-hospital | Attending: Physical Medicine & Rehabilitation | Admitting: Physical Medicine & Rehabilitation

## 2014-11-28 DIAGNOSIS — S32401D Unspecified fracture of right acetabulum, subsequent encounter for fracture with routine healing: Secondary | ICD-10-CM

## 2014-11-28 DIAGNOSIS — F17210 Nicotine dependence, cigarettes, uncomplicated: Secondary | ICD-10-CM | POA: Diagnosis present

## 2014-11-28 DIAGNOSIS — I824Z1 Acute embolism and thrombosis of unspecified deep veins of right distal lower extremity: Secondary | ICD-10-CM | POA: Diagnosis present

## 2014-11-28 DIAGNOSIS — D62 Acute posthemorrhagic anemia: Secondary | ICD-10-CM | POA: Diagnosis present

## 2014-11-28 DIAGNOSIS — H9202 Otalgia, left ear: Secondary | ICD-10-CM | POA: Diagnosis present

## 2014-11-28 DIAGNOSIS — K5900 Constipation, unspecified: Secondary | ICD-10-CM | POA: Diagnosis present

## 2014-11-28 DIAGNOSIS — S060X0D Concussion without loss of consciousness, subsequent encounter: Secondary | ICD-10-CM

## 2014-11-28 DIAGNOSIS — R52 Pain, unspecified: Secondary | ICD-10-CM

## 2014-11-28 DIAGNOSIS — S12600D Unspecified displaced fracture of seventh cervical vertebra, subsequent encounter for fracture with routine healing: Secondary | ICD-10-CM

## 2014-11-28 DIAGNOSIS — T402X5A Adverse effect of other opioids, initial encounter: Secondary | ICD-10-CM | POA: Diagnosis present

## 2014-11-28 DIAGNOSIS — M79605 Pain in left leg: Secondary | ICD-10-CM | POA: Diagnosis not present

## 2014-11-28 DIAGNOSIS — S27329D Contusion of lung, unspecified, subsequent encounter: Secondary | ICD-10-CM | POA: Diagnosis not present

## 2014-11-28 DIAGNOSIS — F419 Anxiety disorder, unspecified: Secondary | ICD-10-CM | POA: Diagnosis present

## 2014-11-28 DIAGNOSIS — I10 Essential (primary) hypertension: Secondary | ICD-10-CM | POA: Diagnosis present

## 2014-11-28 DIAGNOSIS — S12100A Unspecified displaced fracture of second cervical vertebra, initial encounter for closed fracture: Secondary | ICD-10-CM | POA: Diagnosis present

## 2014-11-28 DIAGNOSIS — K5909 Other constipation: Secondary | ICD-10-CM | POA: Diagnosis present

## 2014-11-28 DIAGNOSIS — K5903 Drug induced constipation: Secondary | ICD-10-CM | POA: Diagnosis present

## 2014-11-28 DIAGNOSIS — S12100D Unspecified displaced fracture of second cervical vertebra, subsequent encounter for fracture with routine healing: Principal | ICD-10-CM

## 2014-11-28 DIAGNOSIS — S32401A Unspecified fracture of right acetabulum, initial encounter for closed fracture: Secondary | ICD-10-CM | POA: Diagnosis present

## 2014-11-28 DIAGNOSIS — T1490XA Injury, unspecified, initial encounter: Secondary | ICD-10-CM

## 2014-11-28 DIAGNOSIS — S2220XD Unspecified fracture of sternum, subsequent encounter for fracture with routine healing: Secondary | ICD-10-CM

## 2014-11-28 DIAGNOSIS — M79604 Pain in right leg: Secondary | ICD-10-CM | POA: Diagnosis not present

## 2014-11-28 DIAGNOSIS — R1319 Other dysphagia: Secondary | ICD-10-CM | POA: Diagnosis present

## 2014-11-28 DIAGNOSIS — S72051D Unspecified fracture of head of right femur, subsequent encounter for closed fracture with routine healing: Secondary | ICD-10-CM | POA: Diagnosis not present

## 2014-11-28 DIAGNOSIS — M79606 Pain in leg, unspecified: Secondary | ICD-10-CM | POA: Diagnosis present

## 2014-11-28 DIAGNOSIS — R1310 Dysphagia, unspecified: Secondary | ICD-10-CM | POA: Diagnosis present

## 2014-11-28 DIAGNOSIS — F411 Generalized anxiety disorder: Secondary | ICD-10-CM | POA: Diagnosis present

## 2014-11-28 MED ORDER — FLEET ENEMA 7-19 GM/118ML RE ENEM: 1.0000 | ENEMA | Freq: Once | RECTAL | Status: DC | PRN

## 2014-11-28 MED ORDER — HYDROMORPHONE HCL 1 MG/ML IJ SOLN
0.5000 mg | INTRAMUSCULAR | Status: DC | PRN
  Administered 2014-11-28: 0.5 mg via INTRAVENOUS
  Filled 2014-11-28: qty 1

## 2014-11-28 MED ORDER — ACETAMINOPHEN 325 MG PO TABS
325.0000 mg | ORAL_TABLET | ORAL | Status: DC | PRN
Start: 2014-11-28 — End: 2014-12-12

## 2014-11-28 MED ORDER — DIPHENHYDRAMINE HCL 12.5 MG/5ML PO ELIX
12.5000 mg | ORAL_SOLUTION | Freq: Four times a day (QID) | ORAL | Status: DC | PRN
Start: 2014-11-28 — End: 2014-12-12

## 2014-11-28 MED ORDER — GABAPENTIN 100 MG PO CAPS
100.0000 mg | ORAL_CAPSULE | Freq: Three times a day (TID) | ORAL | Status: DC
  Administered 2014-11-28 – 2014-11-30 (×5): 100 mg via ORAL
  Filled 2014-11-28 (×5): qty 1

## 2014-11-28 MED ORDER — MORPHINE SULFATE ER 15 MG PO TBCR
15.0000 mg | EXTENDED_RELEASE_TABLET | Freq: Two times a day (BID) | ORAL | Status: DC
  Administered 2014-11-28 – 2014-12-12 (×28): 15 mg via ORAL
  Filled 2014-11-28 (×27): qty 1

## 2014-11-28 MED ORDER — IBUPROFEN 200 MG PO TABS
600.0000 mg | ORAL_TABLET | Freq: Three times a day (TID) | ORAL | Status: DC
  Administered 2014-11-28: 600 mg via ORAL
  Filled 2014-11-28: qty 3

## 2014-11-28 MED ORDER — MORPHINE SULFATE ER 15 MG PO TBCR
15.0000 mg | EXTENDED_RELEASE_TABLET | Freq: Two times a day (BID) | ORAL | Status: DC
  Filled 2014-11-28: qty 1

## 2014-11-28 MED ORDER — CLONAZEPAM 0.5 MG PO TABS
0.5000 mg | ORAL_TABLET | Freq: Three times a day (TID) | ORAL | Status: DC | PRN
  Administered 2014-11-28 – 2014-12-12 (×27): 0.5 mg via ORAL
  Filled 2014-11-28 (×32): qty 1

## 2014-11-28 MED ORDER — TRAZODONE HCL 50 MG PO TABS
50.0000 mg | ORAL_TABLET | Freq: Every evening | ORAL | Status: DC | PRN
  Administered 2014-11-30: 50 mg via ORAL
  Filled 2014-11-28 (×2): qty 1

## 2014-11-28 MED ORDER — ALUM & MAG HYDROXIDE-SIMETH 200-200-20 MG/5ML PO SUSP: 30.0000 mL | ORAL | Status: DC | PRN

## 2014-11-28 MED ORDER — POLYETHYLENE GLYCOL 3350 17 G PO PACK
17.0000 g | PACK | Freq: Every day | ORAL | Status: DC
  Administered 2014-11-29 – 2014-12-12 (×14): 17 g via ORAL
  Filled 2014-11-28 (×14): qty 1

## 2014-11-28 MED ORDER — BISACODYL 10 MG RE SUPP: 10.0000 mg | Freq: Every day | RECTAL | Status: DC | PRN

## 2014-11-28 MED ORDER — METOPROLOL TARTRATE 25 MG PO TABS
25.0000 mg | ORAL_TABLET | Freq: Two times a day (BID) | ORAL | Status: DC
  Administered 2014-11-28 – 2014-12-12 (×28): 25 mg via ORAL
  Filled 2014-11-28 (×28): qty 1

## 2014-11-28 MED ORDER — OXYCODONE HCL 5 MG PO TABS
15.0000 mg | ORAL_TABLET | ORAL | Status: DC | PRN
  Administered 2014-11-28 – 2014-11-30 (×8): 20 mg via ORAL
  Administered 2014-11-30: 5 mg via ORAL
  Administered 2014-11-30 (×2): 20 mg via ORAL
  Administered 2014-11-30: 15 mg via ORAL
  Administered 2014-12-01 – 2014-12-03 (×12): 20 mg via ORAL
  Administered 2014-12-03: 5 mg via ORAL
  Administered 2014-12-03 – 2014-12-12 (×48): 20 mg via ORAL
  Administered 2014-12-12: 15 mg via ORAL
  Administered 2014-12-12: 20 mg via ORAL
  Filled 2014-11-28 (×61): qty 4
  Filled 2014-11-28: qty 3
  Filled 2014-11-28 (×2): qty 4
  Filled 2014-11-28: qty 3
  Filled 2014-11-28 (×10): qty 4

## 2014-11-28 MED ORDER — CETYLPYRIDINIUM CHLORIDE 0.05 % MT LIQD
7.0000 mL | Freq: Two times a day (BID) | OROMUCOSAL | Status: DC
  Administered 2014-11-28 – 2014-12-12 (×20): 7 mL via OROMUCOSAL

## 2014-11-28 MED ORDER — FLUTICASONE PROPIONATE 50 MCG/ACT NA SUSP
1.0000 | Freq: Every day | NASAL | Status: DC
  Administered 2014-11-29 – 2014-12-12 (×13): 1 via NASAL
  Filled 2014-11-28: qty 16

## 2014-11-28 MED ORDER — LORATADINE 10 MG PO TABS
10.0000 mg | ORAL_TABLET | Freq: Every day | ORAL | Status: DC
  Administered 2014-11-29 – 2014-12-12 (×14): 10 mg via ORAL
  Filled 2014-11-28 (×14): qty 1

## 2014-11-28 MED ORDER — ENOXAPARIN SODIUM 40 MG/0.4ML ~~LOC~~ SOLN
40.0000 mg | SUBCUTANEOUS | Status: DC
  Administered 2014-11-28: 40 mg via SUBCUTANEOUS
  Filled 2014-11-28: qty 0.4

## 2014-11-28 MED ORDER — FLUTICASONE PROPIONATE 50 MCG/ACT NA SUSP
1.0000 | Freq: Every day | NASAL | Status: DC
  Administered 2014-11-28: 1 via NASAL
  Filled 2014-11-28: qty 16

## 2014-11-28 MED ORDER — METHOCARBAMOL 750 MG PO TABS
750.0000 mg | ORAL_TABLET | Freq: Four times a day (QID) | ORAL | Status: DC
  Administered 2014-11-28 – 2014-12-12 (×54): 750 mg via ORAL
  Filled 2014-11-28 (×54): qty 1

## 2014-11-28 MED ORDER — GUAIFENESIN-DM 100-10 MG/5ML PO SYRP: 5.0000 mL | ORAL_SOLUTION | Freq: Four times a day (QID) | ORAL | Status: DC | PRN

## 2014-11-28 MED ORDER — ONDANSETRON HCL 4 MG PO TABS: 4.0000 mg | ORAL_TABLET | Freq: Four times a day (QID) | ORAL | Status: DC | PRN

## 2014-11-28 MED ORDER — ONDANSETRON HCL 4 MG/2ML IJ SOLN: 4.0000 mg | Freq: Four times a day (QID) | INTRAMUSCULAR | Status: DC | PRN

## 2014-11-28 MED ORDER — BOOST PLUS PO LIQD
237.0000 mL | Freq: Three times a day (TID) | ORAL | Status: DC
Start: 2014-11-29 — End: 2014-12-12
  Administered 2014-11-29 – 2014-12-12 (×31): 237 mL via ORAL
  Filled 2014-11-28 (×49): qty 237

## 2014-11-28 NOTE — Progress Notes (Signed)
Pt was taking iv and po pain med whole night. Explained to pt to slowly wean of from iv pain med and to take po pain med. Pt refused to wean of from iv pain med. Pt stated needs iv and po med's to control the pain.

## 2014-11-28 NOTE — Care Management Note (Signed)
Case Management Note  Patient Details  Name: Paul Galloway MRN: [id number] Date of Birth: [birth year]  Subjective/Objective:   Pt accepted for admission to CIR today.                   Action/Plan: Plan dc to [facility] later today.    Expected Discharge Date:    11/28/2014              Expected Discharge Plan:  IP Rehab Facility  In-House Referral:  Clinical Social Work  Discharge planning Services  CM Consult  Post Acute Care Choice:    Choice offered to:     DME Arranged:    DME Agency:     HH Arranged:    HH Agency:     Status of Service:  Completed, signed off  Medicare Important Message Given:    Date Medicare IM Given:    Medicare IM give by:    Date Additional Medicare IM Given:    Additional Medicare Important Message give by:     If discussed at Long Length of Stay Meetings, dates discussed:    Additional Comments:  Quintella Baton, RN, BSN  Trauma/Neuro ICU Case Manager [phone]
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
## 2014-11-28 NOTE — Progress Notes (Signed)
Patient ID: Paul Galloway, male   DOB: 1970/08/18, 44 y.o.   MRN: [id number] BP 141/92 mmHg  Pulse 58  Temp(Src) 98.6 F (37 C) (Oral)  Resp 16  Ht  (1.803 m)  Wt 86.183 kg (190 lb)  BMI 26.51 kg/m2  SpO2 98% Alert and oriented x 4 Moving all extremities In brace  Will need followup with xrays one month after discharge.
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
## 2014-11-28 NOTE — Progress Notes (Signed)
Rehab admissions - I met with patient this am.  He does want to admit to inpatient rehab today.  Bed available and will admit to acute inpatient rehab today.  Call me for questions.  #[phone]

## 2014-11-28 NOTE — Discharge Instructions (Signed)
You must wear the collar at all times, except when changing pads or bathing. It can be changed when lying on a flat surface. You may used the [place] collar for showers.

## 2014-11-28 NOTE — Progress Notes (Signed)
Orthopedic Tech Progress Note Patient Details:  Paul Galloway [birth year] [id number]  Ortho Devices Type of Ortho Device: [place] cervical collar Ortho Device/Splint Location: neck Ortho Device/Splint Interventions: Paul Galloway, Paul Galloway 11/28/2014, 11:18 AM
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
## 2014-11-29 ENCOUNTER — Inpatient Hospital Stay (HOSPITAL_COMMUNITY): Payer: Self-pay | Admitting: Occupational Therapy

## 2014-11-29 ENCOUNTER — Ambulatory Visit (HOSPITAL_COMMUNITY): Payer: Medicaid Other | Attending: Physical Medicine and Rehabilitation

## 2014-11-29 ENCOUNTER — Inpatient Hospital Stay (HOSPITAL_COMMUNITY): Payer: Medicaid Other | Admitting: Speech Pathology

## 2014-11-29 DIAGNOSIS — S32401D Unspecified fracture of right acetabulum, subsequent encounter for fracture with routine healing: Secondary | ICD-10-CM

## 2014-11-29 DIAGNOSIS — S7291XD Unspecified fracture of right femur, subsequent encounter for closed fracture with routine healing: Secondary | ICD-10-CM

## 2014-11-29 DIAGNOSIS — R52 Pain, unspecified: Secondary | ICD-10-CM

## 2014-11-29 DIAGNOSIS — M79605 Pain in left leg: Secondary | ICD-10-CM | POA: Diagnosis not present

## 2014-11-29 DIAGNOSIS — M79604 Pain in right leg: Secondary | ICD-10-CM | POA: Insufficient documentation

## 2014-11-29 DIAGNOSIS — S129XXS Fracture of neck, unspecified, sequela: Secondary | ICD-10-CM

## 2014-11-29 DIAGNOSIS — T1490 Injury, unspecified: Secondary | ICD-10-CM

## 2014-11-29 LAB — CBC WITH DIFFERENTIAL/PLATELET
Basophils Absolute: 0 10*3/uL (ref 0.0–0.1)
Basophils Relative: 0 % (ref 0–1)
Eosinophils Absolute: 0.2 10*3/uL (ref 0.0–0.7)
Eosinophils Relative: 3 % (ref 0–5)
HCT: 41.5 % (ref 39.0–52.0)
HEMOGLOBIN: 13.9 g/dL (ref 13.0–17.0)
LYMPHS ABS: 1.2 10*3/uL (ref 0.7–4.0)
LYMPHS PCT: 16 % (ref 12–46)
MCH: 32.9 pg (ref 26.0–34.0)
MCHC: 33.5 g/dL (ref 30.0–36.0)
MCV: 98.1 fL (ref 78.0–100.0)
MONOS PCT: 13 % — AB (ref 3–12)
Monocytes Absolute: 1 10*3/uL (ref 0.1–1.0)
NEUTROS ABS: 5.1 10*3/uL (ref 1.7–7.7)
NEUTROS PCT: 68 % (ref 43–77)
Platelets: 220 10*3/uL (ref 150–400)
RBC: 4.23 MIL/uL (ref 4.22–5.81)
RDW: 13.6 % (ref 11.5–15.5)
WBC: 7.5 10*3/uL (ref 4.0–10.5)

## 2014-11-29 LAB — COMPREHENSIVE METABOLIC PANEL
ALBUMIN: 3 g/dL — AB (ref 3.5–5.0)
ALK PHOS: 78 U/L (ref 38–126)
ALT: 25 U/L (ref 17–63)
AST: 26 U/L (ref 15–41)
Anion gap: 10 (ref 5–15)
BUN: 12 mg/dL (ref 6–20)
CALCIUM: 9.1 mg/dL (ref 8.9–10.3)
CO2: 29 mmol/L (ref 22–32)
Chloride: 99 mmol/L — ABNORMAL LOW (ref 101–111)
Creatinine, Ser: 0.74 mg/dL (ref 0.61–1.24)
GFR calc Af Amer: >60 mL/min (ref >60)
GFR calc non Af Amer: >60 mL/min (ref >60)
Glucose, Bld: 112 mg/dL — ABNORMAL HIGH (ref 65–99)
POTASSIUM: 4.1 mmol/L (ref 3.5–5.1)
SODIUM: 138 mmol/L (ref 135–145)
Total Bilirubin: 0.5 mg/dL (ref 0.3–1.2)
Total Protein: 6.1 g/dL — ABNORMAL LOW (ref 6.5–8.1)

## 2014-11-29 MED ORDER — DOUBLE ANTIBIOTIC 500-10000 UNIT/GM EX OINT
TOPICAL_OINTMENT | Freq: Every day | CUTANEOUS | Status: DC
  Administered 2014-11-29 – 2014-12-12 (×12): via TOPICAL
  Filled 2014-11-29 (×10): qty 1

## 2014-11-29 MED ORDER — RIVAROXABAN 15 MG PO TABS
15.0000 mg | ORAL_TABLET | Freq: Two times a day (BID) | ORAL | Status: DC
  Administered 2014-11-29 – 2014-12-12 (×26): 15 mg via ORAL
  Filled 2014-11-29 (×28): qty 1

## 2014-11-29 MED ORDER — RIVAROXABAN 15 MG PO TABS: 15.0000 mg | ORAL_TABLET | Freq: Two times a day (BID) | ORAL | Status: DC

## 2014-11-29 MED ORDER — RIVAROXABAN 20 MG PO TABS: 20.0000 mg | ORAL_TABLET | Freq: Every day | ORAL | Status: DC

## 2014-11-29 NOTE — Progress Notes (Signed)
Patient information reviewed and entered into eRehab system by Kittie Krizan, RN, CRRN, PPS Coordinator.  Information including medical coding and functional independence measure will be reviewed and updated through discharge.    

## 2014-11-29 NOTE — Progress Notes (Signed)
Preliminary results by tech - Bilateral Lower Ext. Venous Duplex Completed. Deep vein thrombosis involving one of the paired right peroneal veins at the mid and distal calf level. Left lower extremity - no evidence of deep or superficial vein thrombosis. Results given to patient's nurse. Marilynne Halsted, BS, RDMS, RVT
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
## 2014-11-29 NOTE — Progress Notes (Signed)
Pt was admitted to 4W 24 via recliner chair with staff. Family/friends present at bedside. VS stable, and in no current signs of distress. Pt oriented to unit and went over rehab schedule. Safety Prevention Fall Plan signed, bed alarm on. Will continue to monitor. Rudie Meyer, RN
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
## 2014-11-29 NOTE — Care Management Note (Signed)
[facility] Individual Statement of Services  Patient Name:  Paul Galloway  Date:  11/29/2014  Welcome to the [facility].  Our goal is to provide you with an individualized program based on your diagnosis and situation, designed to meet your specific needs.  With this comprehensive rehabilitation program, you will be expected to participate in at least 3 hours of rehabilitation therapies Monday-Friday, with modified therapy programming on the weekends.  Your rehabilitation program will include the following services:  Physical Therapy (PT), Occupational Therapy (OT), Speech Therapy (ST), 24 hour per day rehabilitation nursing, Therapeutic Recreaction (TR), Neuropsychology, Case Management (Social Worker), Rehabilitation Medicine, Nutrition Services and Pharmacy Services  Weekly team conferences will be held on Wednesday to discuss your progress.  Your Social Worker will talk with you frequently to get your input and to update you on team discussions.  Team conferences with you and your family in attendance may also be held.  Expected length of stay: 10-14 days  Overall anticipated outcome: supervision set up/mod/i level  Depending on your progress and recovery, your program may change. Your Social Worker will coordinate services and will keep you informed of any changes. Your Social Worker's name and contact numbers are listed  below.  The following services may also be recommended but are not provided by the [facility]:   Driving Evaluations  Home Health Rehabiltiation Services  Outpatient Rehabilitation Services  Vocational Rehabilitation   Arrangements will be made to provide these services after discharge if needed.  Arrangements include referral to agencies that provide these services.  Your insurance has been verified to be:  None Your primary doctor is:  [employer] [facility] [employer]  Pertinent information  will be shared with your doctor and your insurance company.  Social Worker:  Amada [place], [place] [phone] or ([phone]   Information discussed with and copy given to patient by: Lucy Chris, 11/29/2014, 12:24 PM
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
## 2014-11-30 ENCOUNTER — Inpatient Hospital Stay (HOSPITAL_COMMUNITY): Payer: Medicaid Other | Admitting: Physical Therapy

## 2014-11-30 ENCOUNTER — Inpatient Hospital Stay (HOSPITAL_COMMUNITY): Payer: Medicaid Other | Admitting: Occupational Therapy

## 2014-11-30 DIAGNOSIS — S129XXD Fracture of neck, unspecified, subsequent encounter: Secondary | ICD-10-CM

## 2014-11-30 MED ORDER — TRAZODONE HCL 50 MG PO TABS
100.0000 mg | ORAL_TABLET | Freq: Every day | ORAL | Status: DC
  Administered 2014-12-01 – 2014-12-02 (×2): 100 mg via ORAL
  Filled 2014-11-30 (×3): qty 2

## 2014-11-30 MED ORDER — GABAPENTIN 100 MG PO CAPS
200.0000 mg | ORAL_CAPSULE | Freq: Three times a day (TID) | ORAL | Status: DC
  Administered 2014-11-30 – 2014-12-12 (×36): 200 mg via ORAL
  Filled 2014-11-30 (×36): qty 2

## 2014-11-30 NOTE — Discharge Instructions (Addendum)
Inpatient Rehab Discharge Instructions  Paul Galloway Discharge date and time:  12/12/14   Activities/Precautions/ Functional Status: Activity: no lifting, driving, or strenuous exercise till cleared by MD. Toe- Touch weight on right leg. Continue right hip precuations till cleared by Dr. Eulah Pont.  Diet: regular diet Wound Care:  Wash with soap and water. Pat dry.   Functional status:  ___ No restrictions     ___ Walk up steps independently ___ 24/7 supervision/assistance   ___ Walk up steps with assistance ___ Intermittent supervision/assistance  ___ Bathe/dress independently _X__ Walk with walker    ___ Bathe/dress with assistance ___ Walk Independently    ___ Shower independently ___ Walk with assistance    ___ Shower with assistance _X__ No alcohol     ___ Return to work/school ________  Special Instructions: 1. Keep follow up appointments.  2. Use tylenol for mild pain as needed.  3. IN 7 days decrease oxycodone use to one pill every 6-8 hours as needed.  Continue to wean every week to one pill every 8- 12 hours as pain improves.      My questions have been answered and I understand these instructions. I will adhere to these goals and the provided educational materials after my discharge from the hospital.  Patient/Caregiver Signature _______________________________ Date __________  Clinician Signature _______________________________________ Date __________  Please bring this form and your medication list with you to all your follow-up doctor's appointments.     Information on my medicine - XARELTO (rivaroxaban)  This medication education was reviewed with me or my healthcare representative as part of my discharge preparation.    WHY WAS XARELTO PRESCRIBED FOR YOU? Xarelto was prescribed to treat blood clots that may have been found in the veins of your legs (deep vein thrombosis) or in your lungs (pulmonary embolism) and to reduce the risk of them occurring  again.  What do you need to know about Xarelto? The starting dose is one 15 mg tablet taken TWICE daily with food for the FIRST 21 DAYS then on (enter date)  12/21/14  the dose is changed to one 20 mg tablet taken ONCE A DAY with your evening meal.  DO NOT stop taking Xarelto without talking to the health care provider who prescribed the medication.  Refill your prescription for 20 mg tablets before you run out.  After discharge, you should have regular check-up appointments with your healthcare provider that is prescribing your Xarelto.  In the future your dose may need to be changed if your kidney function changes by a significant amount.  What do you do if you miss a dose? If you are taking Xarelto TWICE DAILY and you miss a dose, take it as soon as you remember. You may take two 15 mg tablets (total 30 mg) at the same time then resume your regularly scheduled 15 mg twice daily the next day.  If you are taking Xarelto ONCE DAILY and you miss a dose, take it as soon as you remember on the same day then continue your regularly scheduled once daily regimen the next day. Do not take two doses of Xarelto at the same time.   Important Safety Information Xarelto is a blood thinner medicine that can cause bleeding. You should call your healthcare provider right away if you experience any of the following: ? Bleeding from an injury or your nose that does not stop. ? Unusual colored urine (red or dark brown) or unusual colored stools (red or black). ? Unusual  bruising for unknown reasons. ? A serious fall or if you hit your head (even if there is no bleeding).  Some medicines may interact with Xarelto and might increase your risk of bleeding while on Xarelto. To help avoid this, consult your healthcare provider or pharmacist prior to using any new prescription or non-prescription medications, including herbals, vitamins, non-steroidal anti-inflammatory drugs (NSAIDs) and supplements.  This  website has more information on Xarelto: VisitDestination.com.br.

## 2014-11-30 NOTE — Progress Notes (Signed)
Physical Therapy Session Note  Patient Details  Name: Paul Galloway MRN: [id number] Date of Birth: 09-18-1970  Today's Date: 11/30/2014 PT Individual Time: 1005-1105 PT Individual Time Calculation (min): 60 min   Short Term Goals: Week 1:  PT Short Term Goal 1 (Week 1): Pt will demonstrate transfers from a variety of surfaces with supervision and RW PT Short Term Goal 2 (Week 1): Pt will ambulate 50 feet with RW and min A PT Short Term Goal 3 (Week 1): Pt will maintain weight bearing status when OOB 100% of the time without cuing. PT Short Term Goal 4 (Week 1): Pt will negotiate 4 steps with 2 hand rails and min A.  Skilled Therapeutic Interventions/Progress Updates:    W/C: PT instructs patient in w/c propulsion x150' to therapy gym using BUEs. PT instructs patient in management of brakes. PT switched pt w/c to high back/reclining w/c 20x17 with ELRs. Pt reports the back support feels better.   Gait Training: Pt instructed in ambulation with RW and steady A. Pt performed TUG (avg 63 sec) with RW requiring steady A. Pt required seated rest break after each trial x1-2 minutes. PT discussed meaning of results with patient and patient verbalized understanding. Pt demos decreased safety awareness with walker, advancing it forward outside BOS to increase gait speed.  Pt returned to room at end of session in w/c and positioned to comfort with needs in reach.   Therapy Documentation Precautions:  Precautions Precautions: Posterior Hip, Cervical Precaution Booklet Issued: No Precaution Comments: pt able to recall 3/3 hip precautions and weight bearing restrictions independently Required Braces or Orthoses: Cervical Brace, Spinal Brace Cervical Brace: Hard collar, At all times Spinal Brace:  (CTO) Restrictions Weight Bearing Restrictions: Yes RLE Weight Bearing: Touchdown weight bearing  Pain: Pain Assessment Pain Assessment: 0-10 Pain Score: 6  Pain Type: Acute pain Pain Location:  Neck  See FIM for current functional status  Therapy/Group: Individual Therapy  Ladora Daniel Penven-Crew 11/30/2014, 12:34 PM
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
## 2014-12-01 ENCOUNTER — Inpatient Hospital Stay (HOSPITAL_COMMUNITY): Payer: Medicaid Other | Admitting: Physical Therapy

## 2014-12-01 ENCOUNTER — Inpatient Hospital Stay (HOSPITAL_COMMUNITY): Payer: Medicaid Other | Admitting: Occupational Therapy

## 2014-12-01 DIAGNOSIS — M542 Cervicalgia: Secondary | ICD-10-CM

## 2014-12-01 DIAGNOSIS — F411 Generalized anxiety disorder: Secondary | ICD-10-CM

## 2014-12-01 DIAGNOSIS — K5901 Slow transit constipation: Secondary | ICD-10-CM
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
## 2014-12-01 NOTE — Progress Notes (Signed)
Physical Therapy Session Note  Patient Details  Name: Paul Galloway MRN: [id number] Date of Birth: 1970/09/25  Today's Date: 12/01/2014 PT Individual Time: 0900-1000 PT Individual Time Calculation (min): 60 min   Short Term Goals: Week 1:  PT Short Term Goal 1 (Week 1): Pt will demonstrate transfers from a variety of surfaces with supervision and RW PT Short Term Goal 2 (Week 1): Pt will ambulate 50 feet with RW and min A PT Short Term Goal 3 (Week 1): Pt will maintain weight bearing status when OOB 100% of the time without cuing. PT Short Term Goal 4 (Week 1): Pt will negotiate 4 steps with 2 hand rails and min A.  Skilled Therapeutic Interventions/Progress Updates:    Pt received seated in standard chair with cervical collar on. Donned R shoe with total A for time management. Session focused on activity tolerance, gait training, and car transfers. See below for details on gait, basic transfers. In ortho gym, pt performed stand pivot transfer from w/c <> simulated car with RW and mod A to control descent during stand > sit to car seat. Cueing provided for technique with focus on scooting posteriorly in car seat and reclining seat to increase independence/decrease pain with RLE management. Session ended in pt room, where pt was left seated in standard chair with all needs within reach.  Therapy Documentation Precautions:  Precautions Precautions: Posterior Hip, Cervical Precaution Booklet Issued: No Precaution Comments: pt able to recall 3/3 hip precautions and weight bearing restrictions independently Required Braces or Orthoses: Cervical Brace, Spinal Brace Cervical Brace: Hard collar, At all times Spinal Brace:  (CTO) Restrictions Weight Bearing Restrictions: Yes RLE Weight Bearing: Touchdown weight bearing Pain: Pain Assessment Pain Assessment: 0-10 Pain Score: 5  Pain Location: Neck Pain Orientation: Posterior Pain Descriptors / Indicators: Aching Pain Onset: On-going Pain  Intervention(s): Medication (See eMAR) Multiple Pain Sites: No Locomotion : Ambulation Ambulation: Yes Ambulation/Gait Assistance: 4: Min guard Ambulation Distance (Feet): 133 Feet (and 41') Assistive device: Rolling walker Ambulation/Gait Assistance Details: Verbal cues for precautions/safety Ambulation/Gait Assistance Details: Cueing focused on adherence to TDWB on RLE during gait. Corporate treasurer: Yes Wheelchair Assistance: 5: Investment banker, operational: Both upper extremities Wheelchair Parts Management: Needs assistance Distance: 75 (UE propulsion limited by pain in cervical spine)   See FIM for current functional status  Therapy/Group: Individual Therapy  Seyon Strader, Lorenda Ishihara 12/01/2014, 12:47 PM
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
## 2014-12-01 NOTE — Progress Notes (Signed)
Physical Therapy Session Note  Patient Details  Name: Paul Galloway MRN: [id number] Date of Birth: 1971/01/30  Today's Date: 12/01/2014 PT Individual Time: 1430-1500 PT Individual Time Calculation (min): 30 min   Short Term Goals: Week 1:  PT Short Term Goal 1 (Week 1): Pt will demonstrate transfers from a variety of surfaces with supervision and RW PT Short Term Goal 2 (Week 1): Pt will ambulate 50 feet with RW and min A PT Short Term Goal 3 (Week 1): Pt will maintain weight bearing status when OOB 100% of the time without cuing. PT Short Term Goal 4 (Week 1): Pt will negotiate 4 steps with 2 hand rails and min A.  Skilled Therapeutic Interventions/Progress Updates:    Pt received seated in standard chair accompanied by spouse and friends. Performed gait x75' in controlled environment with RW with subtle cueing for adherence to RLE TDWB. Transported pt remaining distance to gym with Total A for energy conservation. Negotiated 2 stairs with B rails; see below for further detail. Pt did require Max A (lift and lower assist) for stand pivot transfers with RW during this session secondary to increased pain, fatigue. Due to pt request to remain in w/c post-session, retrieved w/c cushion. Departed with pt seated in w/c with visitors presents and all needs within reach.  Therapy Documentation Precautions:  Precautions Precautions: Posterior Hip, Cervical Precaution Booklet Issued: No Precaution Comments: pt able to recall 3/3 hip precautions and weight bearing restrictions independently Required Braces or Orthoses: Cervical Brace, Spinal Brace Cervical Brace: Hard collar, At all times Spinal Brace:  (CTO) Restrictions Weight Bearing Restrictions: Yes RLE Weight Bearing: Touchdown weight bearing Vital Signs: Therapy Vitals Temp: 98.6 F (37 C) Temp Source: Oral Pulse Rate: 75 Resp: 18 BP: 122/71 mmHg Patient Position (if appropriate): Sitting Oxygen Therapy SpO2: 98 % O2 Device:  Not Delivered Pain: Pain Assessment Pain Assessment: 0-10 Pain Score: 5  Pain Type: Acute pain Pain Location: Neck Pain Orientation: Posterior Pain Descriptors / Indicators: Aching Pain Onset: On-going Pain Intervention(s): RN made aware (RN already aware; pt premedicated) Locomotion : Ambulation Ambulation: Yes Ambulation/Gait Assistance: 4: Min guard Ambulation Distance (Feet): 75 Feet Assistive device: Rolling walker Stairs / Additional Locomotion Stairs: Yes Stairs Assistance: 2: Max assist;3: Mod assist Stairs Assistance Details: Tactile cues for initiation;Tactile cues for sequencing;Visual cues/gestures for sequencing;Verbal cues for technique;Verbal cues for precautions/safety Stairs Assistance Details (indicate cue type and reason): Ascended forward with mod A; descended backwards with Max A due to increased difficulty using UE's, increased pain in cervical spine. Stair Management Technique: Two rails;Step to pattern;Backwards;Forwards Number of Stairs: 2 Height of Stairs: 4  See FIM for current functional status  Therapy/Group: Individual Therapy  Hobble, Lorenda Ishihara 12/01/2014, 4:19 PM

## 2014-12-02 ENCOUNTER — Inpatient Hospital Stay (HOSPITAL_COMMUNITY): Payer: Self-pay | Admitting: Rehabilitation

## 2014-12-02 DIAGNOSIS — R51 Headache: Secondary | ICD-10-CM
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
## 2014-12-02 NOTE — Progress Notes (Signed)
Physical Therapy Session Note  Patient Details  Name: Paul Galloway MRN: [id number] Date of Birth: 14-Jul-1970  Today's Date: 12/02/2014 PT Individual Time: 4098-1191 PT Individual Time Calculation (min): 45 min   Short Term Goals: Week 1:  PT Short Term Goal 1 (Week 1): Pt will demonstrate transfers from a variety of surfaces with supervision and RW PT Short Term Goal 2 (Week 1): Pt will ambulate 50 feet with RW and min A PT Short Term Goal 3 (Week 1): Pt will maintain weight bearing status when OOB 100% of the time without cuing. PT Short Term Goal 4 (Week 1): Pt will negotiate 4 steps with 2 hand rails and min A.  Skilled Therapeutic Interventions/Progress Updates:   Pt received sitting in recliner in room, agreeable to therapy session but with increased c/o pain, fatigue and needing to use urinal.  Allowed pt to sit at [facility] and use urinal independently prior to beginning.  Assisted with donning shorts while pt able to don shirt (slight assist for pulling over head due to pain).  Stood with RW at min/guard level with good adherence to TDWB on RLE.  Pt able to get pants up and zipped/buttoned with steady A.  Transferred to w/c via stand pivot at min/guard A level.  Once in w/c, pt stating that pain is too great and he feels that he cannot participate in therapy.  Pt agreeable to transferring back to bed and performing bed level exercise.  Requires min/mod A to lower trunk back into bed with decreased pain and ensuring brace in proper spot.  Performed supine therex as follows; BLE SLR x 10 reps (active assist on RLE for safety), B quad sets x 10 reps w/ 5 sec hold, R hip abd x 10 reps (active assisted), B glute sets x 10 reps, B ankle pumps x 15 reps, and BLE SAQ's x 10 reps.   Pt left in bed with all needs in reach in bed.    Therapy Documentation Precautions:  Precautions Precautions: Posterior Hip, Cervical Precaution Booklet Issued: No Precaution Comments: pt able to recall 3/3 hip  precautions and weight bearing restrictions independently Required Braces or Orthoses: Cervical Brace, Spinal Brace Cervical Brace: Hard collar, At all times Spinal Brace:  (CTO) Restrictions Weight Bearing Restrictions: Yes RLE Weight Bearing: Touchdown weight bearing   Vital Signs: Therapy Vitals Temp: 98.7 F (37.1 C) Temp Source: Oral Pulse Rate: 81 Resp: 20 BP: 124/76 mmHg Patient Position (if appropriate): Sitting Oxygen Therapy SpO2: 97 % O2 Device: Not Delivered Pain: Pain Assessment Pain Assessment: 0-10 Pain Score: 8  Pain Type: Acute pain Pain Location: Neck Pain Descriptors / Indicators: Aching Pain Onset: On-going Pain Intervention(s): Medication (See eMAR)  See FIM for current functional status  Therapy/Group: Individual Therapy  Vista Deck 12/02/2014, 9:15 AM
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
## 2014-12-03 ENCOUNTER — Inpatient Hospital Stay (HOSPITAL_COMMUNITY): Payer: Medicaid Other

## 2014-12-03 ENCOUNTER — Inpatient Hospital Stay (HOSPITAL_COMMUNITY): Payer: Self-pay | Admitting: Physical Therapy

## 2014-12-03 DIAGNOSIS — S060X4S Concussion with loss of consciousness of 6 hours to 24 hours, sequela: Secondary | ICD-10-CM

## 2014-12-03 LAB — CBC
HCT: 38.8 % — ABNORMAL LOW (ref 39.0–52.0)
Hemoglobin: 12.7 g/dL — ABNORMAL LOW (ref 13.0–17.0)
MCH: 32.2 pg (ref 26.0–34.0)
MCHC: 32.7 g/dL (ref 30.0–36.0)
MCV: 98.2 fL (ref 78.0–100.0)
Platelets: 298 10*3/uL (ref 150–400)
RBC: 3.95 MIL/uL — AB (ref 4.22–5.81)
RDW: 13.2 % (ref 11.5–15.5)
WBC: 9.5 10*3/uL (ref 4.0–10.5)

## 2014-12-03 LAB — BASIC METABOLIC PANEL
Anion gap: 9 (ref 5–15)
BUN: 16 mg/dL (ref 6–20)
CALCIUM: 9 mg/dL (ref 8.9–10.3)
CO2: 28 mmol/L (ref 22–32)
Chloride: 97 mmol/L — ABNORMAL LOW (ref 101–111)
Creatinine, Ser: 0.81 mg/dL (ref 0.61–1.24)
GFR calc Af Amer: >60 mL/min (ref >60)
GLUCOSE: 105 mg/dL — AB (ref 65–99)
POTASSIUM: 4.1 mmol/L (ref 3.5–5.1)
Sodium: 134 mmol/L — ABNORMAL LOW (ref 135–145)

## 2014-12-03 MED ORDER — QUETIAPINE FUMARATE 50 MG PO TABS
50.0000 mg | ORAL_TABLET | Freq: Every day | ORAL | Status: DC
  Administered 2014-12-03 – 2014-12-05 (×3): 50 mg via ORAL
  Filled 2014-12-03 (×5): qty 1

## 2014-12-03 MED ORDER — QUETIAPINE FUMARATE 50 MG PO TABS: 50.0000 mg | ORAL_TABLET | Freq: Every evening | ORAL | Status: DC | PRN
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
## 2014-12-03 NOTE — Progress Notes (Signed)
Physical Therapy Session Note  Patient Details  Name: Paul Galloway MRN: [id number] Date of Birth: 04-Dec-1970  Today's Date: 12/03/2014 PT Individual Time: 0900-1000 PT Individual Time Calculation (min): 60 min   Short Term Goals: Week 1:  PT Short Term Goal 1 (Week 1): Pt will demonstrate transfers from a variety of surfaces with supervision and RW PT Short Term Goal 2 (Week 1): Pt will ambulate 50 feet with RW and min A PT Short Term Goal 3 (Week 1): Pt will maintain weight bearing status when OOB 100% of the time without cuing. PT Short Term Goal 4 (Week 1): Pt will negotiate 4 steps with 2 hand rails and min A.  Skilled Therapeutic Interventions/Progress Updates:    Gait Training: Pt received in recliner, agreeable to therapy. Pt performs sit<>stand transfers from recliner and from therapy mat x2 with supervision and verbal cues using RW. PT instructed patient in ambulation with RW x150' with supervision and verbal cues for weight bearing status and activation of lower trap/lats with UE weight bearing during LLE step through. PT instructed patient in 4" curb negotiation with RW, min A to ascend backward, descend forward. Verbal cues for sequencing. Pt performed 2x and stated he could not continue.   Ther-ex: PT instructed patient in LE exercise in recumbent position on mat. Pt performed 15 reps bilaterally: ankle pumps, quad sets, glute sets, and heel slides.    Therapy Documentation Precautions:  Precautions Precautions: Posterior Hip, Cervical Precaution Booklet Issued: No Precaution Comments: pt able to recall 3/3 hip precautions and weight bearing restrictions independently Required Braces or Orthoses: Cervical Brace, Spinal Brace Cervical Brace: Hard collar, At all times Spinal Brace:  (CTO) Restrictions Weight Bearing Restrictions: Yes RLE Weight Bearing: Touchdown weight bearing   Pain: Pain Assessment Pain Assessment: 0-10 Pain Score: 6  Pain Type: Acute pain Pain  Location: Neck Pain Orientation: Posterior  See FIM for current functional status  Therapy/Group: Individual Therapy  Roizy Harold E Penven-Crew 12/03/2014, 12:12 PM
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
## 2014-12-03 NOTE — Progress Notes (Signed)
Occupational Therapy Note  Patient Details  Name: JESIEL GARATE MRN: [id number] Date of Birth: 02/02/1971  Today's Date: 12/03/2014 OT Individual Time: 4098-1191 OT Individual Time Calculation (min): 60 min   Pt c/o 6/10 pain in posterior neck; RN aware and repositioned Individual Therapy  Pt asleep in recliner upon arrival but easily aroused.  Pt agreeable to therapy and donned left shoe prior to stand pivot transfer with RW.  Pt transitioned to tub room to discuss bathing options after discharge.  Pt transitioned to therapy gym and practiced transfers and bed mobility to mat set at height approximating bed height (30'). Pt performs sit<>stand and transfer with steady A.  Pt required min A for bed mobility.  Pt practiced transfers X 4.  Pt returned to room and used urinal while standing, requiring steady A.  Pt washed hands while standing at sink before amb with RW approx 4' to recliner.  Focus on sit<>stand, standing balance, functional transfers, bed mobility, and safety awareness.     Ivin, [facility] 12/03/2014, 12:18 PM
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
## 2014-12-03 NOTE — Progress Notes (Signed)
Occupational Therapy Session Note  Patient Details  Name: Paul Galloway MRN: [id number] Date of Birth: [birth year]  Today's Date: 12/03/2014 OT Individual Time: 9629-5284 OT Individual Time Calculation (min): 60 min   Short Term Goals: Week 1:  OT Short Term Goal 1 (Week 1): Pt will perform bathing with min A in order to decrease level of assist for self care.  OT Short Term Goal 2 (Week 1): Pt will perform LB dressing with Min A and use of AE as needed in order to increase I with self care.  OT Short Term Goal 3 (Week 1): Pt will perform toilet hygiene and clothing management with steady assist in order to increase I in self care.  Skilled Therapeutic Interventions/Progress Updates: ADL-retraining with focus on adherence to cervical precautions, transfers, orthotics management and adapted bathing/dressing skills.   Pt received seated at EOB self-feeding his breakfast.   Pt was re-educated on need to adhere to cervical precautions as chart review revealed that pt had removed collar during the weekend d/t discomfort.   Pt acknowledged precaution and clarified that his behavior of non-adherence was unusual for him, suspect d/t disorientation or confusion.   Pt proceeded through transfer to straight chair using RW with steadying assist to bathing at sink.  Pt used reacher to don pants and required only min assist overall with bathing.   Pt was unable to reach his buttocks during this session.  Pt returned to recliner from w/c at end of session with all needs within reach.      Therapy Documentation Precautions:  Precautions Precautions: Posterior Hip, Cervical Precaution Booklet Issued: No Precaution Comments: pt able to recall 3/3 hip precautions and weight bearing restrictions independently Required Braces or Orthoses: Cervical Brace, Spinal Brace Cervical Brace: Hard collar, At all times Spinal Brace:  (CTO) Restrictions Weight Bearing Restrictions: Yes RLE Weight Bearing: Touchdown  weight bearing   Pain: Pain Assessment Pain Assessment: 0-10 Pain Score: 6  Pain Type: Acute pain Pain Location: Neck Pain Orientation: Posterior   See FIM for current functional status  Therapy/Group: Individual Therapy  Kaeden Depaz 12/03/2014, 12:16 PM

## 2014-12-03 NOTE — Progress Notes (Signed)
Physical Therapy Session Note  Patient Details  Name: Paul Galloway MRN: [id number] Date of Birth: [birth year]  Today's Date: 12/03/2014 PT Individual Time: 1530-1600 PT Individual Time Calculation (min): 30 min   Short Term Goals: Week 1:  PT Short Term Goal 1 (Week 1): Pt will demonstrate transfers from a variety of surfaces with supervision and RW PT Short Term Goal 2 (Week 1): Pt will ambulate 50 feet with RW and min A PT Short Term Goal 3 (Week 1): Pt will maintain weight bearing status when OOB 100% of the time without cuing. PT Short Term Goal 4 (Week 1): Pt will negotiate 4 steps with 2 hand rails and min A.  Skilled Therapeutic Interventions/Progress Updates:   W/C Mobility: PT instructed patient in w/c propulsion x150 with supervision. Pt required cues for equal push with R/L UE. Pt continues to require assist for parts management.   Therapeutic Activity: Pt performs sit<>stand transfers with RW with supervision. PT instructed patient in car transfer with verbal cues for sequencing and supervision for safety. Pt continues to require occasional cues for posterior hip precautions and weight bearing restriction.   Pt c/o swelling in RLE. Pt reports it's not painful it just feels like it is going to burst. No heat noted from calf, no pain with squeeze. Noted pitting edema up to knee on RLE.   Therapy Documentation Precautions:  Precautions Precautions: Posterior Hip, Cervical Precaution Booklet Issued: No Precaution Comments: pt able to recall 3/3 hip precautions and weight bearing restrictions independently Required Braces or Orthoses: Cervical Brace, Spinal Brace Cervical Brace: Hard collar, At all times Spinal Brace:  (CTO) Restrictions Weight Bearing Restrictions: Yes RLE Weight Bearing: Touchdown weight bearing  Pain: Pain Assessment Pain Assessment: Faces Faces Pain Scale: Hurts even more  Locomotion : Wheelchair Mobility Distance: 150   See FIM for current  functional status  Therapy/Group: Individual Therapy  Ladora Daniel Penven-Crew 12/03/2014, 4:43 PM

## 2014-12-04 ENCOUNTER — Inpatient Hospital Stay (HOSPITAL_COMMUNITY): Payer: Medicaid Other

## 2014-12-04 ENCOUNTER — Inpatient Hospital Stay (HOSPITAL_COMMUNITY): Payer: Medicaid Other | Admitting: *Deleted

## 2014-12-04 ENCOUNTER — Inpatient Hospital Stay (HOSPITAL_COMMUNITY): Payer: Self-pay

## 2014-12-04 ENCOUNTER — Inpatient Hospital Stay (HOSPITAL_COMMUNITY): Payer: Self-pay | Admitting: Physical Therapy

## 2014-12-04 NOTE — Progress Notes (Signed)
Occupational Therapy Note  Patient Details  Name: JOVEN MOM MRN: [id number] Date of Birth: 1971-01-12  Today's Date: 12/04/2014 OT Individual Time: 1300-1400 OT Individual Time Calculation (min): 60 min   Pt c/o 8/10 pain in poster neck; pt also c/o sporadic spasms in right upper leg; RN aware and med admin Individual Therapy  Pt engaged in dynamic standing tasks, functional amb with RW, and sit<>stand from w/c.  Activities included tossing horseshoes and retrieving from floor with reacher. Pt also engaged in placing horseshoes on elevated surface.  Focus on adhering to weight bearing precautions.   Pr requires more than a reasonable amount of time with multiple rest breaks to complete tasks.  Pt is deliberate with all transitional movement and is hypervigilant regarding safety.  Pt returned to recliner in room with all needs within reach and friends present.  [place], Roggenkamp [facility] 12/04/2014, 2:09 PM
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
## 2014-12-04 NOTE — Progress Notes (Signed)
Physical Therapy Session Note  Patient Details  Name: Paul Galloway MRN: [id number] Date of Birth: [birth year]  Today's Date: 12/04/2014 PT Individual Time: 0830-0930 PT Individual Time Calculation (min): 60 min   Short Term Goals: Week 1:  PT Short Term Goal 1 (Week 1): Pt will demonstrate transfers from a variety of surfaces with supervision and RW PT Short Term Goal 2 (Week 1): Pt will ambulate 50 feet with RW and min A PT Short Term Goal 3 (Week 1): Pt will maintain weight bearing status when OOB 100% of the time without cuing. PT Short Term Goal 4 (Week 1): Pt will negotiate 4 steps with 2 hand rails and min A.  Skilled Therapeutic Interventions/Progress Updates:    Therapeutic Activity: Pt received in bathroom. PT instructed patient in toilet transfer and performed total assist hygiene for patient. Pt ambulated from bathroom to recliner and performed stand>sit transfer with RW and supervision for safety. Pt donned shirt and shorts independently. PT assisted patient with tongue of shoe for donning R and L shoe. Pt propelled w/c 150' to therapy gym with supervision and assist for brakes and leg rest management.  Gait training: PT instructed patient in stair negotiation forward ascent/descent with mod A. Pt negotiated 4 6" steps to ascend and 8 3" steps to descending using BUE support on R hand rail and mod A for control with verbal cues for weight bearing restriction and hip precautions. Pt ambulated x 50 feet with RW and supervision/steady A back towards room with verbal cues for weight bearing restriction.   Pt left sitting in recliner with call bell in reach and needs met.   Therapy Documentation Precautions:  Precautions Precautions: Posterior Hip, Cervical Precaution Booklet Issued: No Precaution Comments: pt able to recall 3/3 hip precautions and weight bearing restrictions independently Required Braces or Orthoses: Cervical Brace, Spinal Brace Cervical Brace: Hard collar, At  all times Spinal Brace: Other (comment) (CTO) Restrictions Weight Bearing Restrictions: Yes RLE Weight Bearing: Touchdown weight bearing  Pain: Pain Assessment Pain Assessment: 0-10 Pain Score: 4  Pain Type: Acute pain Pain Location: Neck Pain Orientation: Posterior Pain Descriptors / Indicators: Aching Pain Onset: On-going Pain Intervention(s): Rest;Other (Comment) (Taping)   Function:  Herbalist Devices: Grab bar or rail;Prosthesis/orthosis   Bed Mobility Roll left and right activity Roll left and right activity did not occur:  (pt received in bathroom)    Sit to lying activity      Lying to sitting activity      Mobility details     Transfers Sit to stand transfer   Sit to stand assist level: Supervision or verbal cues Sit to stand assistive device: Armrests;Walker;Orthosis (TLSO)  Chair/bed transfer   Chair/bed transfer method: Ambulatory Chair/bed transfer assist level: Supervision or verbal cues Chair/bed transfer assistive device: Armrests;Walker;Orthosis (TLSO)   Chair/bed transfer details: Verbal cues for Careers information officer transfer assistive device: Elevated toilet seat/BSC over toilet;Grab Environmental consultant device: Walker-rolling Max distance: 50 Assist level: Supervision or verbal cues  Walk 10 feet activity   Assist level: Supervision or verbal cues  Walk 50 feet with 2 turns activity   Assist level: Supervision or verbal cues  Walk 150 feet activity      Walk 10 feet on uneven surfaces activity      Stairs   Stairs assistive device: 1 hand  rail Max number of stairs: 4 Stairs assist level: Moderate assist (Pt 50 - 74%)  Walk up/down 1 step activity        Walk up/down 4 steps activity   Walk up/down 4 steps assist level: Moderate assist (Pt 50 - 74%)  Walk up/down 12 steps activity Walk up/down 12 steps activity did not occur:  Safety/medical concerns    Pick up small objects from floor Pick up small object from the floor (from standing position) activity did not occur: N/A (hip precautions)    Wheelchair   Type: Manual Max wheelchair distance: 150 Assist Level: Supervision or verbal cues  Wheel 50 feet with 2 turns activity   Assist Level: Supervision or verbal cues  Wheel 150 feet activity   Assist Level: Supervision or verbal cues   Cognition Comprehension Comprehension assist level: Follows complex conversation/direction with no assist  Expression Expression assist level: Expresses complex 90% of the time/cues < 10% of the time  Social Interaction Social Interaction assist level: Interacts appropriately with others with medication or extra time (anti-anxiety, antidepressant).  Problem Solving Problem solving assist level: Solves basic 90% of the time/requires cueing < 10% of the time  Memory Memory assist level: Recognizes or recalls 90% of the time/requires cueing < 10% of the time    Therapy/Group: Individual Therapy  Paul Galloway 12/04/2014, 12:36 PM

## 2014-12-04 NOTE — Progress Notes (Signed)
Occupational Therapy Session Note  Patient Details  Name: Paul Galloway MRN: [id number] Date of Birth: 11/04/1970  Today's Date: 12/04/2014 OT Individual Time: 1000-1100 OT Individual Time Calculation (min): 60 min    Short Term Goals: Week 1:  OT Short Term Goal 1 (Week 1): Pt will perform bathing with min A in order to decrease level of assist for self care.  OT Short Term Goal 2 (Week 1): Pt will perform LB dressing with Min A and use of AE as needed in order to increase I with self care.  OT Short Term Goal 3 (Week 1): Pt will perform toilet hygiene and clothing management with steady assist in order to increase I in self care.  Skilled Therapeutic Interventions/Progress Updates:    Pt declined bathing and dressing this morning.  Pt stated he had changed shirts earlier.  Pt engaged in practicing tub bench transfers and toilet transfers.  Pt propelled w/c to/from room to increase independence with w/c mobility.  Pt requires more than a reasonable amount of time to complete tasks and adheres to all precautions.  Pt exhibits safe and appropriate behavior throughout session.  Pt stated he feels like he is getting weaker and demonstrated difficulty with keeping eyes open.  Focus on functional amb with RW for home mgmt tasks, functional transfers, sit<>stand, standing balance, and safety awareness to increased independence with BADLs.  Therapy Documentation Precautions:  Precautions Precautions: Posterior Hip, Cervical Precaution Booklet Issued: No Precaution Comments: pt able to recall 3/3 hip precautions and weight bearing restrictions independently Required Braces or Orthoses: Cervical Brace, Spinal Brace Cervical Brace: Hard collar, At all times Spinal Brace:  (CTO) Restrictions Weight Bearing Restrictions: Yes RLE Weight Bearing: Touchdown weight bearing   Pain: Pain Assessment Pain Assessment: 0-10 Pain Score: 9  Pain Type: Acute pain Pain Location: Neck Pain Orientation:  Posterior Pain Descriptors / Indicators: Aching Pain Onset: On-going Pain Intervention(s): RN made aware;Repositioned  Function:   Eating Eating               Grooming Oral Care,Brush Teeth, Clean Dentures Activity:             Wash, Rinse, Dry Face Activity          Wash, Rinse, Dry Hands Activity          Brush, Comb Hair Activity        Shave Activity          Apply Makeup Activity                                                             Bathing Bathing position      Bathing parts      Bathing assist         Upper Body Dressing/Undressing Upper body dressing                    Upper body assist         Lower Body Dressing/Undressing Lower body dressing                                  Lower body assist         Toileting Toileting   Toileting steps  completed by patient: Adjust clothing prior to toileting;Performs perineal hygiene;Adjust clothing after toileting   Toileting Assistive Devices: Grab bar or rail  Toileting assist Assist level: Touching or steadying assistance (Pt.75%)    Bed Mobility Roll left and right activity      Sit to lying activity      Lying to sitting activity      Mobility details     Transfers Sit to stand transfer        Chair/bed transfer              Toilet transfer   Toilet transfer assistive device: Elevated toilet seat/BSC over toilet;Walker;Grab bar       Assist level to toilet: Touching or steadying assistance (Pt > 75%) Assist level from toilet: Touching or steadying assistance (Pt > 75%)  Tub/shower transfer Tub/shower transfer activity did not occur: Simulated Tub/shower assistive device: Tub transfer bench;Walker;Grab bars   Assist level into tub: Touching or steadying assistance (Pt > 75%/lift 1 leg) Assist level out of tub: Touching or steadying assistance (Pt > 75%)   Cognition Comprehension    Expression    Social Interaction    Problem Solving     Memory      Therapy/Group: Individual Therapy  Bejamin, Hackbart 12/04/2014, 12:15 PM

## 2014-12-04 NOTE — Progress Notes (Signed)
Physical Therapy Session Note  Patient Details  Name: Paul Galloway MRN: [id number] Date of Birth: [birth year]  Today's Date: 12/04/2014 PT Individual Time: 1100-1135 PT Individual Time Calculation (min): 35 min   Short Term Goals: Week 1:  PT Short Term Goal 1 (Week 1): Pt will demonstrate transfers from a variety of surfaces with supervision and RW PT Short Term Goal 2 (Week 1): Pt will ambulate 50 feet with RW and min A PT Short Term Goal 3 (Week 1): Pt will maintain weight bearing status when OOB 100% of the time without cuing. PT Short Term Goal 4 (Week 1): Pt will negotiate 4 steps with 2 hand rails and min A.  Skilled Therapeutic Interventions/Progress Updates:    Patient seen in recliner, performed transfer from recliner and wheelchair supervision increased time for positioning right LE and due to pain.  Patient stood with supervision one hand on chair, one on walker, able to maintain TDWB right LE.  Patient ambulated 40', 47' and 15' with RW and supervision, increased time, maintaining TDWB right LE and with c/o pain in shoulders, neck and sternum due to weight bearing on UE's.  Applied kinesiotape to bilateral upper trap area with off the paper tension and muscle on slight stretch with shoulder depression.  Patient given instructions on wear and to watch for rash or itching and reported felt good.  Left in recliner all needs in reach and legs elevated.  Therapy Documentation Precautions:  Precautions Precautions: Posterior Hip, Cervical Precaution Booklet Issued: No Precaution Comments: pt able to recall 3/3 hip precautions and weight bearing restrictions independently Required Braces or Orthoses: Cervical Brace, Spinal Brace Cervical Brace: Hard collar, At all times Spinal Brace: Other (comment) (CTO) Restrictions Weight Bearing Restrictions: Yes RLE Weight Bearing: Touchdown weight bearing General:   Vital Signs:   Pain: Pain Assessment Pain Assessment: 0-10 Pain  Score: 8  Pain Type: Acute pain Pain Location: Neck Pain Orientation: Posterior Pain Descriptors / Indicators: Aching Pain Onset: On-going Pain Intervention(s): Rest;Other (Comment) (Taping) Mobility:   Locomotion :    Trunk/Postural Assessment :    Balance:   Exercises:   Other Treatments:     Function:  Government social research officer Devices: Grab bar or rail   Bed Mobility Roll left and right activity      Sit to lying activity      Lying to sitting activity      Mobility details     Transfers Sit to stand transfer   Sit to stand assist level: Supervision or verbal cues Sit to stand assistive device: Armrests;Walker;Orthosis (CTO)  Chair/bed Primary school teacher transfer assistive device: Elevated toilet seat/BSC over toilet;Walker;Grab Teacher, adult education device: Walker-rolling Max distance: 60 Assist level: Supervision or verbal cues  Walk 10 feet activity      Walk 50 feet with 2 turns activity      Walk 150 feet activity      Walk 10 feet on uneven surfaces activity      Stairs          Walk up/down 1 step activity        Walk up/down 4 steps activity      Walk up/down 12 steps activity      Pick up small objects from floor  Wheelchair          Wheel 50 feet with 2 turns activity      Wheel 150 feet activity       Cognition Comprehension Comprehension assist level: Follows basic conversation/direction with no assist;Understands complex 90% of the time/cues 10% of the time  Expression Expression assist level: Expresses complex ideas: With extra time/assistive device  Social Interaction Social Interaction assist level: Interacts appropriately 90% of the time - Needs monitoring or encouragement for participation or interaction.  Problem Solving Problem solving assist level: Solves basic problems with no assist  Memory Memory assist level:  Recognizes or recalls 90% of the time/requires cueing < 10% of the time    Therapy/Group: Individual Therapy  Rudi Coco [place], [place] [phone] 12/04/2014  12/04/2014, 12:25 PM

## 2014-12-05 ENCOUNTER — Inpatient Hospital Stay (HOSPITAL_COMMUNITY): Payer: Medicaid Other | Admitting: Occupational Therapy

## 2014-12-05 ENCOUNTER — Ambulatory Visit (HOSPITAL_COMMUNITY): Payer: Self-pay | Admitting: *Deleted

## 2014-12-05 DIAGNOSIS — S72051S Unspecified fracture of head of right femur, sequela: Secondary | ICD-10-CM

## 2014-12-05 DIAGNOSIS — S32401S Unspecified fracture of right acetabulum, sequela: Secondary | ICD-10-CM

## 2014-12-05 NOTE — Progress Notes (Signed)
Occupational Therapy Session Note  Patient Details  Name: Paul Galloway MRN: [id number] Date of Birth: 16-May-1970  Today's Date: 12/05/2014 OT Individual Time: 7829-5621 OT Individual Time Calculation (min): 73 min  and Today's Date: 12/05/2014 OT Missed Time: 17 Minutes Missed Time Reason: Pain;Patient fatigue   Short Term Goals: Week 1:  OT Short Term Goal 1 (Week 1): Pt will perform bathing with min A in order to decrease level of assist for self care.  OT Short Term Goal 2 (Week 1): Pt will perform LB dressing with Min A and use of AE as needed in order to increase I with self care.  OT Short Term Goal 3 (Week 1): Pt will perform toilet hygiene and clothing management with steady assist in order to increase I in self care.  Skilled Therapeutic Interventions/Progress Updates:  Upon entering the room, pt supine in bed with 8/10 pain in R hip this session. Medication not due for another 1.5 hours. RN was notified. Pt required min verbal cues to maintain hip precautions this session during self care tasks and positioning in recliner chair. Pt ambulated while maintaining precautions with use of RW and close supervision for safety 10 '. Pt then declined to sit on toilet and instead stood to use urinal with steady assist to maintain balance during clothing management and voiding. Pt then returning to sit in recliner chair with supervision. Pt declined bathing LB and changing pants but he did don B socks with use of sock aide and min verbal cues for proper technique. Pt washing UB and donning pull over shirt. Pt required increased time this session for rest breaks secondary to faitgue and pain. Pt declining further treatment secondary to these deficits. Pt also stating, "I feel weak. I need to rest." RN arriving with medication and OT discussed pt's concerns. Pt seated in recliner chair with call bell and all needed items within reach. Pt's LEs elevated secondary to edema.   Therapy  Documentation Precautions:  Precautions Precautions: Posterior Hip, Cervical Precaution Booklet Issued: No Precaution Comments: pt able to recall 3/3 hip precautions and weight bearing restrictions independently Required Braces or Orthoses: Cervical Brace, Spinal Brace Cervical Brace: Hard collar, At all times Spinal Brace: Other (comment) (CTO) Restrictions Weight Bearing Restrictions: Yes RLE Weight Bearing: Touchdown weight bearing General: General OT Amount of Missed Time: 17 Minutes Vital Signs:  Pain: Pain Assessment Pain Score: Asleep  Function:   Eating Eating               Grooming Oral Care,Brush Teeth, Clean Dentures Activity:             Wash, Rinse, Dry Face Activity          Wash, Rinse, Dry Hands Activity          Brush, Comb Hair Activity        Shave Activity          Apply Makeup Activity                                                             Bathing Bathing position Bathing activity did not occur:  (UB only)    Bathing parts Body parts bathed by patient: Right arm;Left arm;Chest;Abdomen Body parts bathed by helper: Back  Bathing assist Assist Level: Set  up   Set up : To obtain items;To adjust water temperature   Upper Body Dressing/Undressing Upper body dressing   What is the patient wearing?: Pull over shirt/dress     Pull over shirt/dress - Perfomed by patient: Thread/unthread right sleeve;Thread/unthread left sleeve;Put head through opening;Pull shirt over trunk          Upper body assist Assist Level: Set up   Set up : To obtain clothing/put away   Lower Body Dressing/Undressing Lower body dressing Lower body dressing/undressing activity did not occur: N/A                                Lower body assist         Toileting Toileting   Toileting steps completed by patient: Adjust clothing prior to toileting;Adjust clothing after toileting;Performs perineal hygiene   Toileting Assistive  Devices: Grab bar or rail;Prosthesis/orthosis  Toileting assist Assist level: Touching or steadying assistance (Pt.75%)    Bed Mobility Roll left and right activity      Sit to lying activity   Assist level: Touching or steadying assistance (Pt > 75%, lift 1 leg)  Lying to sitting activity   Assist level: Touching or steadying assistance (Pt > 75%, lift 1 leg)  Mobility details     Transfers Sit to stand transfer   Sit to stand assist level: Supervision or verbal cues Sit to stand assistive device: Armrests;Walker;Orthosis  Chair/bed transfer   Chair/bed transfer method: Ambulatory Chair/bed transfer assist level: Supervision or verbal cues Chair/bed transfer assistive device: Armrests;Walker;Orthosis   Chair/bed transfer details: Verbal cues for Child psychotherapist transfer assistive device:  (Pt standing within RW and utilizing urinal)            Tub/shower transfer             Cognition Comprehension Comprehension assist level: Follows complex conversation/direction with no assist  Expression Expression assist level: Expresses complex 90% of the time/cues < 10% of the time  Social Interaction Social Interaction assist level: Interacts appropriately with others with medication or extra time (anti-anxiety, antidepressant).  Problem Solving Problem solving assist level: Solves basic 90% of the time/requires cueing < 10% of the time  Memory Memory assist level: Recognizes or recalls 90% of the time/requires cueing < 10% of the time    Therapy/Group: Individual Therapy  Lowella Grip 12/05/2014, 9:24 AM

## 2014-12-05 NOTE — Progress Notes (Signed)
Physical Therapy Session Note  Patient Details  Name: Paul Galloway MRN: [id number] Date of Birth: May 10, 1970  Today's Date: 12/05/2014 PT Individual Time: 1100-1200 PT Individual Time Calculation (min): 60 min   Short Term Goals: Week 1:  PT Short Term Goal 1 (Week 1): Pt will demonstrate transfers from a variety of surfaces with supervision and RW PT Short Term Goal 2 (Week 1): Pt will ambulate 50 feet with RW and min A PT Short Term Goal 3 (Week 1): Pt will maintain weight bearing status when OOB 100% of the time without cuing. PT Short Term Goal 4 (Week 1): Pt will negotiate 4 steps with 2 hand rails and min A.  Skilled Therapeutic Interventions/Progress Updates:     W/C: PT instructed pt in w/c propulsion x150 with BUEs with supervision. Pt requires assist for brakes 2/2 posterior brake placement on w/c and assist for leg rests 2/2 posterior hip precautions.  Pt able to navigate turns and obstacles with increased time.  Gait Training: PT instructed patient in negotiation of 4" curb step x5 reps with initial min A progressing to supervision. Verbal cues for technique and sequencing. PT instructed patient in ambulation from therapy gym back to patients room x150' with RW and supervision with increased time.   Pt positioned in recliner with call bell in reach and needs met.   Therapy Documentation Precautions:  Precautions Precautions: Posterior Hip, Cervical Precaution Booklet Issued: No Precaution Comments: pt able to recall 3/3 hip precautions and weight bearing restrictions independently Required Braces or Orthoses: Cervical Brace, Spinal Brace Cervical Brace: Hard collar, At all times Spinal Brace: Other (comment) (CTO) Restrictions Weight Bearing Restrictions: Yes RLE Weight Bearing: Touchdown weight bearing  Pain: Pain Assessment Pain Assessment: 0-10 Pain Score: 8  Pain Location: Neck    Function:  Herbalist Devices: Grab bar  or rail;Prosthesis/orthosis   Bed Mobility Roll left and right activity      Sit to lying activity   Assist level: Touching or steadying assistance (Pt > 75%, lift 1 leg)  Lying to sitting activity   Assist level: Touching or steadying assistance (Pt > 75%, lift 1 leg)  Mobility details     Transfers Sit to stand transfer   Sit to stand assist level: No help, no cues, assistive device, takes more than a reasonable amount of time Sit to stand assistive device: Walker;Orthosis (CTLSO)  Chair/bed transfer   Chair/bed transfer method: Ambulatory Chair/bed transfer assist level: Supervision or verbal cues Chair/bed transfer assistive device: Armrests;Walker;Orthosis (C-TLSO)   Chair/bed transfer details: Verbal cues for Careers information officer transfer assistive device:  (Pt standing within RW and utilizing urinal)    Haematologist device: Walker-rolling Max distance: 150 Assist level: Supervision or verbal cues  Walk 10 feet activity   Assist level: Supervision or verbal cues  Walk 50 feet with 2 turns activity   Assist level: Supervision or verbal cues  Walk 150 feet activity   Assist level: Supervision or verbal cues  Walk 10 feet on uneven surfaces activity      Stairs   Stairs assistive device: Walker Max number of stairs: 5 Stairs assist level: Touching or steadying assistance (Pt > 75%)  Walk up/down 1 step activity     Walk up/down 1 step (curb) assist level: Touching or steadying assistance (Pt > 75%)  Walk up/down 4 steps activity  Walk up/down 4 steps assist level: Touching or steadying assistance (Pt > 75%)  Walk up/down 12 steps activity      Pick up small objects from floor Pick up small object from the floor (from standing position) activity did not occur: N/A (posterior hip precautions)    Wheelchair   Type: Manual Max wheelchair distance: 150 Assist Level: No help, No cues, assistive  device, takes more than reasonable amount of time;Supervision or verbal cues (assist only for leg rests 2/2 post hip precautions and brakes 2/2 posterior brake placement)  Wheel 50 feet with 2 turns activity   Assist Level: No help, No cues, assistive device, takes more than reasonable amount of time  Wheel 150 feet activity   Assist Level: No help, No cues, assistive device, takes more than reasonable amount of time   Cognition Comprehension Comprehension assist level: Follows complex conversation/direction with no assist  Expression Expression assist level: Expresses complex 90% of the time/cues < 10% of the time  Social Interaction Social Interaction assist level: Interacts appropriately with others with medication or extra time (anti-anxiety, antidepressant).  Problem Solving Problem solving assist level: Solves basic 90% of the time/requires cueing < 10% of the time  Memory Memory assist level: Recognizes or recalls 90% of the time/requires cueing < 10% of the time    Therapy/Group: Individual Therapy  Earnest Conroy [place] 12/05/2014, 12:35 PM
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
## 2014-12-05 NOTE — Progress Notes (Signed)
Previously CDI wound RLE , now draining mod amt serous drng; peri-wound with fld. filled blister forming. Some increased edema since yesterday; pt. needs cues and assist. to elevate BLE's when OOB. Ace to RLE. Rt. knee dressing changed, min. amt.yellow slough remainis; lg. amt. removed yesterday with cleansing and dressing change.  Paul Galloway [facility] aware, orders received.

## 2014-12-05 NOTE — Progress Notes (Signed)
Occupational Therapy Session Note  Patient Details  Name: Paul Galloway MRN: [id number] Date of Birth: 12-10-1970  Today's Date: 12/05/2014 OT Individual Time: 2130-8657 OT Individual Time Calculation (min): 55 min    Short Term Goals: Week 1:  OT Short Term Goal 1 (Week 1): Pt will perform bathing with min A in order to decrease level of assist for self care.  OT Short Term Goal 2 (Week 1): Pt will perform LB dressing with Min A and use of AE as needed in order to increase I with self care.  OT Short Term Goal 3 (Week 1): Pt will perform toilet hygiene and clothing management with steady assist in order to increase I in self care.  Skilled Therapeutic Interventions/Progress Updates:    Upon entering the room, pt seated in recliner chair with 7/10 c/o pain in L hip this session. Pain medication given priot to this session. Pt having difficulty recalling events from today's therapy session. OT recommended pt utilize notepad as Merchant navy officer. Pt began to use not pad in order to write down questions he had for RN and MD. OT reminding pt throughout of important details that needed to be written down with pt doing so. Pt standing at sink side for grooming tasks, see details below, pt required min verbal cues for safety. Pt standing to utilize urinal as well successfully with supervision - steady assist for balance. Pt ambulated 150' towards RN station and back with use of RW and close supervision. Pt returning to recliner chair. B LEs elevated secondary to continued edema in LE. Call bell and all needed items within reach upon exiting the room.   Therapy Documentation Precautions:  Precautions Precautions: Posterior Hip, Cervical Precaution Booklet Issued: No Precaution Comments: pt able to recall 3/3 hip precautions and weight bearing restrictions independently Required Braces or Orthoses: Cervical Brace, Spinal Brace Cervical Brace: Hard collar, At all times Spinal Brace: Other (comment)  (CTO) Restrictions Weight Bearing Restrictions: Yes RLE Weight Bearing: Touchdown weight bearing General:   Vital Signs: Therapy Vitals Temp: 98.5 F (36.9 C) Temp Source: Oral Pulse Rate: 70 Resp: 14 BP: 135/61 mmHg Patient Position (if appropriate): Sitting Oxygen Therapy SpO2: 97 % O2 Device: Not Delivered  Function:   Eating Eating               Grooming Oral Care,Brush Teeth, Clean Dentures Activity:      Assist Level: Supervision or verbal cues (standing at sink side with RW)      Wash, Rinse, Dry Face Activity          Wash, Rinse, Dry Hands Activity          Brush, Comb Hair Activity        Shave Activity          Apply Makeup Activity                                                             Bathing Bathing position      Bathing parts      Bathing assist         Upper Body Dressing/Undressing Upper body dressing                    Upper body assist  Lower Body Dressing/Undressing Lower body dressing                                  Lower body assist         Toileting Toileting   Toileting steps completed by patient: Adjust clothing prior to toileting;Adjust clothing after toileting;Performs perineal hygiene   Toileting Assistive Devices: Grab bar or rail;Prosthesis/orthosis  Toileting assist Assist level: Touching or steadying assistance (Pt.75%)    Bed Mobility Roll left and right activity      Sit to lying activity      Lying to sitting activity      Mobility details     Transfers Sit to stand transfer   Sit to stand assist level: No help, no cues, assistive device, takes more than a reasonable amount of time Sit to stand assistive device: Walker;Orthosis  Chair/bed transfer   Chair/bed transfer method: Ambulatory Chair/bed transfer assist level: Supervision or verbal cues Chair/bed transfer assistive device: Armrests;Walker;Orthosis   Chair/bed transfer details: Verbal cues  for Child psychotherapist transfer assistive device:  (Pt standing with steady assist to utilize urinal )            Tub/shower transfer             Cognition Comprehension Comprehension assist level: Follows complex conversation/direction with no assist  Expression Expression assist level: Expresses complex 90% of the time/cues < 10% of the time  Social Interaction Social Interaction assist level: Interacts appropriately with others with medication or extra time (anti-anxiety, antidepressant).  Problem Solving Problem solving assist level: Solves basic 90% of the time/requires cueing < 10% of the time  Memory Memory assist level: Recognizes or recalls 90% of the time/requires cueing < 10% of the time    Therapy/Group: Individual Therapy  Lowella Grip 12/05/2014, 4:45 PM

## 2014-12-05 NOTE — Progress Notes (Signed)
Social Work Patient ID: Paul Galloway, male   DOB: [birth year], 44 y.o.   MRN: [id number]   Met with pt today to review team conference.  He is aware and agreeable with targeted d/c date of 8/24 with mod i goals.  C/O of fatigue and decreased alertness due to pain meds.  He denies any concerns about targets.  Will continue to follow for d/c planning and support.  Virgie Chery, LCSW

## 2014-12-05 NOTE — Progress Notes (Signed)
Recreational Therapy Session Note  Patient Details  Name: Paul Galloway MRN: [id number] Date of Birth: Jul 25, 1970 Today's Date: 12/05/2014   Order received and chart reviewed.  Met with pt briefly to discuss TR services and use of leisure time post discharge.  Due to anticipated discharge of 8/24, full eval deferred.  Will monitor through team for participation in community reintegration. [place] 12/05/2014, 11:18 AM

## 2014-12-06 ENCOUNTER — Inpatient Hospital Stay (HOSPITAL_COMMUNITY): Payer: Self-pay | Admitting: Physical Therapy

## 2014-12-06 ENCOUNTER — Inpatient Hospital Stay (HOSPITAL_COMMUNITY): Payer: Medicaid Other | Admitting: Occupational Therapy

## 2014-12-06 DIAGNOSIS — I82409 Acute embolism and thrombosis of unspecified deep veins of unspecified lower extremity: Secondary | ICD-10-CM | POA: Insufficient documentation

## 2014-12-06 DIAGNOSIS — S12100S Unspecified displaced fracture of second cervical vertebra, sequela: Secondary | ICD-10-CM

## 2014-12-06 DIAGNOSIS — I824Z1 Acute embolism and thrombosis of unspecified deep veins of right distal lower extremity: Secondary | ICD-10-CM | POA: Diagnosis present

## 2014-12-06 DIAGNOSIS — I82401 Acute embolism and thrombosis of unspecified deep veins of right lower extremity: Secondary | ICD-10-CM

## 2014-12-06 MED ORDER — PANTOPRAZOLE SODIUM 40 MG PO TBEC
40.0000 mg | DELAYED_RELEASE_TABLET | Freq: Every day | ORAL | Status: DC
  Administered 2014-12-06 – 2014-12-12 (×7): 40 mg via ORAL
  Filled 2014-12-06 (×7): qty 1

## 2014-12-06 NOTE — Plan of Care (Signed)
Problem: RH PAIN MANAGEMENT Goal: RH STG PAIN MANAGED AT OR BELOW PT'S PAIN GOAL <4  Outcome: Not Progressing Report pain as 9

## 2014-12-06 NOTE — Progress Notes (Signed)
Occupational Therapy Weekly Progress Note  Patient Details  Name: Paul Galloway MRN: [id number] Date of Birth: [birth year]  Beginning of progress report period: November 29, 2014 End of progress report period: December 06, 2014  Today's Date: 12/06/2014 OT Individual Time: 6269-4854 OT Individual Time Calculation (min): 57 min    Patient has met 3 of 3 short term goals. Pt making progress towards goals this week but barriers have been increased pain and activity tolerance. OT has implemented education regarding AE to increase I in self care as well as use of notebook as Proofreader.   Patient continues to demonstrate the following deficits: decreased I in self care, increased pain, decreased endurance, decreased strength, decreased balance, decreased activity tolerance, and memory deficits and therefore will continue to benefit from skilled OT intervention to enhance overall performance with BADL.  Patient progressing toward long term goals..  Continue plan of care.  OT Short Term Goals Week 1:  OT Short Term Goal 1 (Week 1): Pt will perform bathing with min A in order to decrease level of assist for self care.  OT Short Term Goal 1 - Progress (Week 1): Met OT Short Term Goal 2 (Week 1): Pt will perform LB dressing with Min A and use of AE as needed in order to increase I with self care.  OT Short Term Goal 2 - Progress (Week 1): Met OT Short Term Goal 3 (Week 1): Pt will perform toilet hygiene and clothing management with steady assist in order to increase I in self care. OT Short Term Goal 3 - Progress (Week 1): Met Week 2:  OT Short Term Goal 1 (Week 2): STGs=LTGs secondary to upcoming discharge  Skilled Therapeutic Interventions/Progress Updates:  Upon entering the room, pt seated in recliner chair awaiting therapist. Pt has new orders for R LE to have TED hose or ace wrap donned. OT wrapped R LE from feet >mid thigh in order to decrease edema. OT educated pt that ACE wrap needs to be  removed by staff before sleeping. Pt utilized long handed reacher and shoe horn in order to don R shoe after wrapping completed with increased time. Pt then began reporting increased pain and describing pain as "nail doing into my head." RN alerted and medication given during session. Pt reporting light sensitively as well as eye fatigue. OT educated pt on mild concussive symptoms again this session as well as pain management. Times written on board for when pt can next ask for medications. OT educated and demonstrated pursed lip breathing as a technique to attempt to calm pt as he was getting upset over increased pain as well. Pt also reporting increased difficulty with swallowing lunch this session. OT recommended SLP evaluation. Pt remained seated in recliner chair with B LEs elevated in order to decrease edema. Call bell and all needed items within reach upon exiting the room. Pt reporting pain decreasing with darkened room and pain medications.   Therapy Documentation Precautions:  Precautions Precautions: Posterior Hip, Cervical Precaution Booklet Issued: No Precaution Comments: pt able to recall 3/3 hip precautions and weight bearing restrictions independently Required Braces or Orthoses: Cervical Brace, Spinal Brace Cervical Brace: Hard collar, At all times Spinal Brace: Other (comment) (CTO) Restrictions Weight Bearing Restrictions: Yes RLE Weight Bearing: Touchdown weight bearing Vital Signs: Therapy Vitals Temp: 98.4 F (36.9 C) Temp Source: Oral Pulse Rate: 67 Resp: 18 BP: 123/67 mmHg Patient Position (if appropriate): Sitting Oxygen Therapy SpO2: 100 % O2 Device: Not Delivered Pain: Pain  Assessment Pain Assessment: 0-10 Pain Score: 9 Pain Type: Acute pain Pain Location: Neck Pain Orientation: Posterior Pain Descriptors / Indicators: Aching Pain Frequency: Intermittent Pain Onset: On-going Pain Intervention(s): Emotional support  Function:      Therapy/Group:  Individual Therapy  Paul Galloway 12/06/2014, 4:56 PM
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
## 2014-12-06 NOTE — Progress Notes (Signed)
Physical Therapy Weekly Progress Note  Patient Details  Name: Paul Galloway MRN: [id number] Date of Birth: 1971-03-18  Beginning of progress report period: November 29, 2014 End of progress report period: December 06, 2014  Today's Date: 12/06/2014 PT Individual Time: 1000-1100 PT Individual Time Calculation (min): 60 min   Patient has met 4 of 4 short term goals.    Patient continues to demonstrate the following deficits: strength, endurance, pain, and safety awareness and therefore will continue to benefit from skilled PT intervention to enhance overall performance with activity tolerance, balance, postural control, awareness, coordination, gait and transfers.  Patient progressing toward long term goals..  Continue plan of care.  PT Short Term Goals Week 2:  PT Short Term Goal 1 (Week 2): STGs = LTGs due to LOS  Skilled Therapeutic Interventions/Progress Updates:   Pt propelled w/c to therapy gym with mod I, assist required only for brakes management (posteriorly positioned brakes) and leg rests (2/2 spinal and posterior hip precautions).   Gait Training: PT instructed patient in negotiation of 6" steps with 2 hand rails and min A. Pt negotiated 4 steps 2x with min A. Verbal cues for sequencing, weight bearing, and technique. Pt ascended/descended steps with forward step-to pattern. Pt ambulates short distances with RW and supervision.   Ther-ex: PT instructs patient in BLE exercise, 3x10 for heel/toe raises, LAQ, hip abd with orange theraband, and hip add with pillow to maintain hip precautions.   Pt returned to room in w/c and positioned in recliner with call bell in reach and needs met.    Therapy Documentation Precautions:  Precautions Precautions: Posterior Hip, Cervical Precaution Booklet Issued: No Precaution Comments: pt able to recall 3/3 hip precautions and weight bearing restrictions independently Required Braces or Orthoses: Cervical Brace, Spinal Brace Cervical Brace:  Hard collar, At all times Spinal Brace: Other (comment) (CTO) Restrictions Weight Bearing Restrictions: Yes RLE Weight Bearing: Touchdown weight bearing   Pain: Pain Assessment Pain Assessment: 0-10 Pain Score: 6     Function:  Herbalist Devices: Grab bar or rail   Bed Mobility Roll left and right activity Roll left and right activity did not occur:  (pt received in recliner and positioned in recliner at end of session)    Sit to lying activity      Lying to sitting activity      Mobility details     Transfers Sit to stand transfer   Sit to stand assist level: No help, no cues, assistive device, takes more than a reasonable amount of time Sit to stand assistive device: Armrests;Walker;Orthosis (CTLSO)  Chair/bed transfer   Chair/bed transfer method: Ambulatory Chair/bed transfer assist level: Set up only Chair/bed transfer assistive device: Armrests;Walker;Orthosis (CTLSO)       Scientist, forensic transfer assistive device: Camera operator          Walk 10 feet activity      Walk 50 feet with 2 turns activity      Walk 150 feet activity      Walk 10 feet on uneven surfaces activity      Stairs   Stairs assistive device: 2 hand rails Max number of stairs: 8 Stairs assist level: Touching or steadying assistance (Pt > 75%)  Walk up/down 1 step activity     Walk up/down 1 step (curb) assist level: Supervision or verbal cues  Walk up/down  4 steps activity   Walk up/down 4 steps assist level: Touching or steadying assistance (Pt > 75%)  Walk up/down 12 steps activity      Pick up small objects from floor      Wheelchair   Type: Manual Max wheelchair distance: 150 Assist Level: Supervision or verbal cues (pt will cont to require assist for w/c parts 2/2 posterior hip precautions and spinal precautions)  Wheel 50 feet with 2 turns activity   Assist Level: No help, No cues,  assistive device, takes more than reasonable amount of time  Wheel 150 feet activity   Assist Level: No help, No cues, assistive device, takes more than reasonable amount of time   Cognition Comprehension Comprehension assist level: Follows basic conversation/direction with no assist  Expression Expression assist level: Expresses basic 90% of the time/requires cueing < 10% of the time.  Social Interaction Social Interaction assist level: Interacts appropriately with others with medication or extra time (anti-anxiety, antidepressant).  Problem Solving Problem solving assist level: Solves complex 90% of the time/cues < 10% of the time  Memory Memory assist level: Recognizes or recalls 90% of the time/requires cueing < 10% of the time    Therapy/Group: Individual Therapy  Earnest Conroy [place] 12/06/2014, 12:49 PM

## 2014-12-06 NOTE — Progress Notes (Signed)
Physical Therapy Session Note  Patient Details  Name: Paul Galloway MRN: [id number] Date of Birth: 1970-07-24  Today's Date: 12/06/2014 PT Individual Time: 1515-1550 PT Individual Time Calculation (min): 35 min   Short Term Goals: Week 2:  PT Short Term Goal 1 (Week 2): STGs = LTGs due to LOS  Skilled Therapeutic Interventions/Progress Updates:    Gait Training: PT instructed patient in ambulation with RW 150'x2 with supervision. Pt demonstrates step through pattern, upright posture, decreased step length bilaterally and decreased weight shift to RLE. PT instructed patient in stair negotiation, 12 steps with 2 hand rails and steady A. Verbal cues for sequencing and weight bearing restriction.   Pt returned to room at end of session and positioned in recliner with call bell in reach and needs met.   Therapy Documentation Precautions:  Precautions Precautions: Posterior Hip, Cervical Precaution Booklet Issued: No Precaution Comments: pt able to recall 3/3 hip precautions and weight bearing restrictions independently Required Braces or Orthoses: Cervical Brace, Spinal Brace Cervical Brace: Hard collar, At all times Spinal Brace: Other (comment) (CTO) Restrictions Weight Bearing Restrictions: Yes RLE Weight Bearing: Touchdown weight bearing    Pain: Pain Assessment Pain Assessment: 0-10 Pain Score: 6  Pain Type: Acute pain Pain Location: Neck Pain Orientation: Posterior Pain Descriptors / Indicators: Aching Pain Frequency: Intermittent Pain Onset: On-going Pain Intervention(s): Emotional support  Function:   Transfers Sit to stand transfer   Sit to stand assist level: No help, no cues, assistive device, takes more than a reasonable amount of time Sit to stand assistive device: Armrests;Walker;Orthosis (CTLSO)  Chair/bed transfer   Chair/bed transfer method: Ambulatory Chair/bed transfer assist level: Set up only Chair/bed transfer assistive device:  Armrests;Walker;Orthosis (CTLSO)       Marine scientist     Max distance: 150 Assist level: Supervision or verbal cues  Walk 10 feet activity   Assist level: Supervision or verbal cues  Walk 50 feet with 2 turns activity   Assist level: Supervision or verbal cues  Walk 150 feet activity   Assist level: Supervision or verbal cues  Walk 10 feet on uneven surfaces activity      Stairs   Stairs assistive device: 2 hand rails Max number of stairs: 12 Stairs assist level: Supervision or verbal cues  Walk up/down 1 step activity     Walk up/down 1 step (curb) assist level: Supervision or verbal cues  Walk up/down 4 steps activity   Walk up/down 4 steps assist level: Supervision or verbal cues  Walk up/down 12 steps activity   Walk up/down 12 steps assist level: Supervision or verbal cues  Pick up small objects from floor Pick up small object from the floor (from standing position) activity did not occur: N/A (posterior hip precautions, spinal precautions)    Wheelchair          Wheel 50 feet with 2 turns activity      Wheel 150 feet activity       Cognition Comprehension Comprehension assist level: Follows basic conversation/direction with no assist  Expression Expression assist level: Expresses basic 90% of the time/requires cueing < 10% of the time.  Social Interaction Social Interaction assist level: Interacts appropriately with others - No medications needed.  Problem Solving Problem solving assist level: Solves complex 90% of the time/cues < 10% of the time  Memory Memory assist level: Recognizes or recalls 50 -  74% of the time/requires cueing 25 - 49% of the time    Therapy/Group: Individual Therapy  Earnest Conroy [place] 12/06/2014, 4:42 PM

## 2014-12-06 NOTE — Plan of Care (Signed)
Problem: RH PAIN MANAGEMENT Goal: RH STG PAIN MANAGED AT OR BELOW PT'S PAIN GOAL <4  Outcome: Not Progressing Pain meds Q4 hours, rates it a 6 or higher

## 2014-12-06 NOTE — Progress Notes (Signed)
Occupational Therapy Session Note  Patient Details  Name: Paul Galloway MRN: [id number] Date of Birth: 12-Jul-1970  Today's Date: 12/06/2014 OT Individual Time: 4098-1191 OT Individual Time Calculation (min): 58 min    Short Term Goals: Week 1:  OT Short Term Goal 1 (Week 1): Pt will perform bathing with min A in order to decrease level of assist for self care.  OT Short Term Goal 2 (Week 1): Pt will perform LB dressing with Min A and use of AE as needed in order to increase I with self care.  OT Short Term Goal 3 (Week 1): Pt will perform toilet hygiene and clothing management with steady assist in order to increase I in self care.  Skilled Therapeutic Interventions/Progress Updates:  Upon entering the room, pt seated in recliner chair awaiting therapist. Pt with 6/10 c/o pain in neck this session. RN notified.  Pt ambulating to dresser with RW and supervision to open drawer and search for clothing. Pt unable to find what he was looking for and became very frustrated and sitting back down in chair. Pt ambulating to toilet with supervision and use of RW. Pt able to successfully void BM this session and required assist with hygiene. Pt returning to sit in wheelchair for bathing and dressing at sink with sit <>stand. Pt requiring supervision for safety and min guard for balance during functional tasks. RN arriving with medication and for dressing changes. Pt left seated in wheelchair with RN. Please see below for greater details of pt performance in  session.   Therapy Documentation Precautions:  Precautions Precautions: Posterior Hip, Cervical Precaution Booklet Issued: No Precaution Comments: pt able to recall 3/3 hip precautions and weight bearing restrictions independently Required Braces or Orthoses: Cervical Brace, Spinal Brace Cervical Brace: Hard collar, At all times Spinal Brace: Other (comment) (CTO) Restrictions Weight Bearing Restrictions: Yes RLE Weight Bearing: Touchdown  weight bearing General:   Vital Signs: Therapy Vitals Temp: 98.3 F (36.8 C) Temp Source: Oral Pulse Rate: (!) 58 Resp: 18 BP: 129/60 mmHg Patient Position (if appropriate): Lying Oxygen Therapy SpO2: 98 % O2 Device: Not Delivered  Function:   Eating Eating Eating activity did not occur: N/A             Grooming Oral Care,Brush Teeth, Clean Dentures Activity:             Wash, Rinse, Dry Face Activity   Assist Level: Set up      Wash, Rinse, Dry Hands Activity   Assist Level: Set up      Brush, Comb Hair Activity   Assist Level: Set up to obtain items    Shave Activity Shave activity did not occur: N/A        Apply Makeup Activity Apply makeup activity did not occur: Patient does not wear makeup                                                           Bathing Bathing position   Position: Other (comment) (sit <>stand with wheelchair at sink)  Bathing parts Body parts bathed by patient: Right arm;Left arm;Chest;Abdomen;Front perineal area;Buttocks;Right upper leg;Left upper leg Body parts bathed by helper: Back;Left lower leg  Bathing assist Assist Level: Touching or steadying assistance(Pt > 75%)   Set up : To obtain items;To adjust  water temperature   Upper Body Dressing/Undressing Upper body dressing   What is the patient wearing?: Pull over shirt/dress     Pull over shirt/dress - Perfomed by patient: Thread/unthread right sleeve;Thread/unthread left sleeve;Put head through opening;Pull shirt over trunk          Upper body assist Assist Level: Set up       Lower Body Dressing/Undressing Lower body dressing   What is the patient wearing?: Pants     Pants- Performed by patient: Thread/unthread right pants leg;Thread/unthread left pants leg;Pull pants up/down;Fasten/unfasten pants                        Lower body assist Assist Level: Assistive device;Touching or steadying assistance (Pt > 75%) Assistive Device Comment: long  handled reacher     Toileting Toileting   Toileting steps completed by patient: Adjust clothing prior to toileting;Adjust clothing after toileting Toileting steps completed by helper: Performs perineal hygiene Toileting Assistive Devices: Grab bar or rail  Toileting assist Assist level: Touching or steadying assistance (Pt.75%)      Transfers Sit to stand transfer   Sit to stand assist level: Supervision or verbal cues Sit to stand assistive device: Walker;Orthosis  Chair/bed Financial controller transfer assistive device: Electronics engineer level to toilet: Supervision or verbal cues Assist level from toilet: Supervision or verbal cues  Tub/shower transfer             Cognition Comprehension Comprehension assist level: Follows basic conversation/direction with no assist  Expression Expression assist level: Expresses basic 90% of the time/requires cueing < 10% of the time.  Social Interaction Social Interaction assist level: Interacts appropriately with others with medication or extra time (anti-anxiety, antidepressant).  Problem Solving Problem solving assist level: Solves complex 90% of the time/cues < 10% of the time  Memory Memory assist level: Recognizes or recalls 90% of the time/requires cueing < 10% of the time    Therapy/Group: Individual Therapy  Lowella Grip 12/06/2014, 9:40 AM

## 2014-12-07 ENCOUNTER — Inpatient Hospital Stay (HOSPITAL_COMMUNITY): Payer: Medicaid Other | Admitting: Occupational Therapy

## 2014-12-07 ENCOUNTER — Inpatient Hospital Stay (HOSPITAL_COMMUNITY): Payer: Self-pay | Admitting: Physical Therapy

## 2014-12-07 ENCOUNTER — Inpatient Hospital Stay (HOSPITAL_COMMUNITY): Payer: Self-pay | Admitting: Speech Pathology

## 2014-12-07 DIAGNOSIS — I824Z1 Acute embolism and thrombosis of unspecified deep veins of right distal lower extremity: Secondary | ICD-10-CM

## 2014-12-07 MED ORDER — TRAZODONE HCL 50 MG PO TABS: 50.0000 mg | ORAL_TABLET | Freq: Every evening | ORAL | Status: DC | PRN
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
## 2014-12-07 NOTE — Progress Notes (Signed)
Physical Therapy Session Note  Patient Details  Name: Paul Galloway MRN: [id number] Date of Birth: 10-31-1970  Today's Date: 12/07/2014 PT Individual Time: 1430-1525 PT Individual Time Calculation (min): 55 min   Short Term Goals: Week 1:  PT Short Term Goal 1 (Week 1): Pt will demonstrate transfers from a variety of surfaces with supervision and RW PT Short Term Goal 1 - Progress (Week 1): Met PT Short Term Goal 2 (Week 1): Pt will ambulate 50 feet with RW and min A PT Short Term Goal 2 - Progress (Week 1): Met PT Short Term Goal 3 (Week 1): Pt will maintain weight bearing status when OOB 100% of the time without cuing. PT Short Term Goal 3 - Progress (Week 1): Met PT Short Term Goal 4 (Week 1): Pt will negotiate 4 steps with 2 hand rails and min A. PT Short Term Goal 4 - Progress (Week 1): Met  Skilled Therapeutic Interventions/Progress Updates:    Gait Training: Pt ambulated with RW 200'x2 with mod I. Pt negotiated 8 steps with 2 handrails with supervision.   Therapeutic Activity: Pt performs sit<>stand transfers from a variety of surfaces with mod I using RW. Pt demonstrates car transfer with supervision and verbal cues for spinal precautions/hip precautions.    Family present during session for education. Pt able to provide appropriate and accurate instruction to family for basic transfers, car transfers, ambulation, and stair negotiation. PT provided family education on d/c needs (supervision for car transfers and stairs) and [place] (pt's girlfriend) demonstrates safe supervision during these activities.  All concerns addressed prior to leaving. Pt positioned in recliner with call bell in reach and needs met.   Therapy Documentation Precautions:  Precautions Precautions: Posterior Hip, Cervical Precaution Booklet Issued: No Precaution Comments: pt able to recall 3/3 hip precautions and weight bearing restrictions independently Required Braces or Orthoses: Cervical Brace, Spinal  Brace Cervical Brace: Hard collar, At all times Spinal Brace: Other (comment) (CTO) Restrictions Weight Bearing Restrictions: Yes RLE Weight Bearing: Touchdown weight bearing  Pain: Pain Assessment Pain Assessment: Faces Pain Score: 5  Faces Pain Scale: Hurts a little bit Pain Location: Neck Pain Intervention(s): Emotional support    Function:  Herbalist Devices: Grab bar or rail   Bed Mobility Roll left and right activity Roll left and right activity did not occur:  (pt received in recliner and positioned in recliner at end of session)    Sit to lying activity      Lying to sitting activity      Mobility details     Transfers Sit to stand transfer   Sit to stand assist level: No help, no cues, assistive device, takes more than a reasonable amount of time Sit to stand assistive device: Armrests;Walker;Orthosis (CTLSO,)  Chair/bed Naval architect transfer assistive device: Camera operator     Max distance: 200 Assist level: Supervision or verbal cues  Walk 10 feet activity   Assist level: Supervision or verbal cues  Walk 50 feet with 2 turns activity   Assist level: Supervision or verbal cues  Walk 150 feet activity   Assist level: Supervision or verbal cues  Walk 10 feet on uneven surfaces activity      Stairs   Stairs assistive device: 2 hand rails Max number of  stairs: 8 Stairs assist level: Supervision or verbal cues  Walk up/down 1 step activity     Walk up/down 1 step (curb) assist level: Supervision or verbal cues  Walk up/down 4 steps activity   Walk up/down 4 steps assist level: Supervision or verbal cues  Walk up/down 12 steps activity   Walk up/down 12 steps assist level: Supervision or verbal cues  Pick up small objects from floor   Assist level: Supervision or verbal cues  Wheelchair   Type: Manual Max wheelchair distance: >300  (community level w/c mobility) Assist Level: No help, No cues, assistive device, takes more than reasonable amount of time  Wheel 50 feet with 2 turns activity   Assist Level: No help, No cues, assistive device, takes more than reasonable amount of time  Wheel 150 feet activity   Assist Level: No help, No cues, assistive device, takes more than reasonable amount of time   Cognition Comprehension Comprehension assist level: Follows complex conversation/direction with no assist  Expression Expression assist level: Expresses complex 90% of the time/cues < 10% of the time  Social Interaction Social Interaction assist level: Interacts appropriately with others - No medications needed.  Problem Solving Problem solving assist level: Solves complex 90% of the time/cues < 10% of the time  Memory Memory assist level: Recognizes or recalls 50 - 74% of the time/requires cueing 25 - 49% of the time    Therapy/Group: Individual Therapy  Earnest Conroy Penven-Crew 12/07/2014, 4:36 PM
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
## 2014-12-07 NOTE — Progress Notes (Signed)
Physical Therapy Session Note  Patient Details  Name: Paul Galloway MRN: [id number] Date of Birth: 1970-09-16  Today's Date: 12/07/2014 PT Individual Time: 1100-1205 PT Individual Time Calculation (min): 65 min    Short Term Goals: Week 1:  PT Short Term Goal 1 (Week 1): Pt will demonstrate transfers from a variety of surfaces with supervision and RW PT Short Term Goal 1 - Progress (Week 1): Met PT Short Term Goal 2 (Week 1): Pt will ambulate 50 feet with RW and min A PT Short Term Goal 2 - Progress (Week 1): Met PT Short Term Goal 3 (Week 1): Pt will maintain weight bearing status when OOB 100% of the time without cuing. PT Short Term Goal 3 - Progress (Week 1): Met PT Short Term Goal 4 (Week 1): Pt will negotiate 4 steps with 2 hand rails and min A. PT Short Term Goal 4 - Progress (Week 1): Met  Skilled Therapeutic Interventions/Progress Updates:    Gait Training: PT instructed patient in ambulation with RW with supervision x200' with verbal cues for erect posture and weight bearing status.   Therapeutic Exercise: PT instructed patient in BLE x20 reps for heel/toe raises, LAQ, SAQ, and ball squeezes.   W/C: PT instructed patient in community level w/c mobility >300 feet with supervision and verbal cues for navigation of doorways, obstacles, and propulsion up a grade and across a variety of surfaces.    Pt returned to room at end of session and positioned in recliner with call bell in reach and needs met.  Therapy Documentation Precautions:  Precautions Precautions: Posterior Hip, Cervical Precaution Booklet Issued: No Precaution Comments: pt able to recall 3/3 hip precautions and weight bearing restrictions independently Required Braces or Orthoses: Cervical Brace, Spinal Brace Cervical Brace: Hard collar, At all times Spinal Brace: Other (comment) (CTO) Restrictions Weight Bearing Restrictions: Yes RLE Weight Bearing: Touchdown weight bearing  Pain: Pain  Assessment Pain Assessment: 0-10 Pain Score: 8  Pain Location: Neck Pain Intervention(s): Emotional support   Function:   Transfers Sit to stand transfer   Sit to stand assist level: No help, no cues, assistive device, takes more than a reasonable amount of time Sit to stand assistive device: Armrests;Walker;Orthosis (CTLSO,)  Chair/bed Engineer, technical sales     Max distance: 200 Assist level: Supervision or verbal cues  Walk 10 feet activity   Assist level: Supervision or verbal cues  Walk 50 feet with 2 turns activity   Assist level: Supervision or verbal cues  Walk 150 feet activity   Assist level: Supervision or verbal cues  Walk 10 feet on uneven surfaces activity      Stairs          Walk up/down 1 step activity        Walk up/down 4 steps activity      Walk up/down 12 steps activity      Pick up small objects from floor      Wheelchair   Type: Manual Max wheelchair distance: >300 (community level w/c mobility) Assist Level: No help, No cues, assistive device, takes more than reasonable amount of time  Wheel 50 feet with 2 turns activity   Assist Level: No help, No cues, assistive device, takes more than reasonable amount of time  Wheel 150 feet activity  Assist Level: No help, No cues, assistive device, takes more than reasonable amount of time   Cognition Comprehension Comprehension assist level: Follows complex conversation/direction with no assist  Expression Expression assist level: Expresses complex 90% of the time/cues < 10% of the time  Social Interaction Social Interaction assist level: Interacts appropriately with others - No medications needed.  Problem Solving Problem solving assist level: Solves complex 90% of the time/cues < 10% of the time  Memory Memory assist level: Recognizes or recalls 50 - 74% of the time/requires cueing 25 - 49% of the time     Therapy/Group: Individual Therapy  Earnest Conroy Penven-Crew 12/07/2014, 12:46 PM

## 2014-12-07 NOTE — Progress Notes (Signed)
Occupational Therapy Session Note  Patient Details  Name: Paul Galloway MRN: [id number] Date of Birth: [birth year]  Today's Date: 12/07/2014 OT Individual Time: 0700-0825 OT Individual Time Calculation (min): 85 min    Short Term Goals: Week 2:  OT Short Term Goal 1 (Week 2): STGs=LTGs secondary to upcoming discharge  Skilled Therapeutic Interventions/Progress Updates:  Upon entering the room, pt seated in recliner chair awaiting therapist. RN changed dressing prior to entering the room and OT applied ACE wrap to R LE. Pt utilized long handled reacher and sock aide this session to don LB clothing as well as B socks and shoes with set up A for equipment and steady assist for clothing management in standing. Pt ambulating 100' with RW and supervision to day room with increased time as well for fatigue. Pt seated at table with breakfast tray while therapist provided paper handouts of hip precautions as requested by pt. Pt verbalized understanding. Pt reports increased anxiety as he states, "I am scared that I am going to choke on my food." Pt is scheduled for SLP eval this session. Pt ambulating back to room and seated in recliner chair with B LEs elevated for edema. Call bell and all needed items within reach upon exiting the room.   Therapy Documentation Precautions:  Precautions Precautions: Posterior Hip, Cervical Precaution Booklet Issued: No Precaution Comments: pt able to recall 3/3 hip precautions and weight bearing restrictions independently Required Braces or Orthoses: Cervical Brace, Spinal Brace Cervical Brace: Hard collar, At all times Spinal Brace: Other (comment) (CTO) Restrictions Weight Bearing Restrictions: Yes RLE Weight Bearing: Touchdown weight bearing General:   Vital Signs: Therapy Vitals Temp: 98.8 F (37.1 C) Temp Source: Oral Pulse Rate: 69 Resp: 18 BP: 126/77 mmHg Patient Position (if appropriate): Sitting Oxygen Therapy SpO2: 98 % O2 Device: Not  Delivered Pain: Pain Assessment Pain Assessment: 0-10 Pain Score: 6  Pain Type: Acute pain Pain Location: Neck Pain Orientation: Posterior Pain Descriptors / Indicators: Aching Pain Frequency: Constant Pain Onset: On-going Patients Stated Pain Goal: 4 Pain Intervention(s): Medication (See eMAR) (sched pain med)  Function:      Transfers Sit to stand transfer   Sit to stand assist level: No help, no cues, assistive device, takes more than a reasonable amount of time    Chair/bed transfer              Toilet transfer                Tub/shower transfer             Cognition Comprehension Comprehension assist level: Follows basic conversation/direction with no assist  Expression Expression assist level: Expresses basic 90% of the time/requires cueing < 10% of the time.  Social Interaction Social Interaction assist level: Interacts appropriately with others - No medications needed.  Problem Solving Problem solving assist level: Solves complex 90% of the time/cues < 10% of the time  Memory Memory assist level: Recognizes or recalls 50 - 74% of the time/requires cueing 25 - 49% of the time    Therapy/Group: Individual Therapy  Lowella Grip 12/07/2014, 8:29 AM

## 2014-12-08 ENCOUNTER — Inpatient Hospital Stay (HOSPITAL_COMMUNITY): Payer: Medicaid Other | Admitting: Occupational Therapy

## 2014-12-08 ENCOUNTER — Inpatient Hospital Stay (HOSPITAL_COMMUNITY): Payer: Medicaid Other | Admitting: Speech Pathology

## 2014-12-08 NOTE — Progress Notes (Signed)
Speech Language Pathology Daily Session Note  Patient Details  Name: Paul Galloway MRN: [id number] Date of Birth: December 13, 1970  Today's Date: 12/08/2014 SLP Individual Time: 1445-1530 SLP Individual Time Calculation (min): 45 min  Short Term Goals: Week 1: SLP Short Term Goal 1 (Week 1): Pt will utilize recommended swallowing strategies with Mod I SLP Short Term Goal 2 (Week 1): Pt will consume trials of advanced solids with minimal overt signs of aspiration with Min A  Skilled Therapeutic Interventions: Skilled ST intervention provided with focus on dysphagia goals. Pt seated upright in chair, 90 degrees to increase swallow safety/decrease aspiration risks. Slp provided trial of D2 textures with thin liquids. Pt stated that he has the sensation of food sticking in the top of his mouth and throat. Pt consumed 100% of snack , self-managing swallow strategies, with no difficulties noted. No oral stasis noted throughout oral cavity. Pt took small bites and alternated solids/liquids with each bite. Pt did express he has noticed a change in his speaking- more so that he "cannot get words to come out". Pt states that he sometimes stumbles over his words and "gets stuck". Clinician noted only 1 instance of this at conversation level- which pt self-corrected, given additional time.     Function:  Eating Eating   Modified Consistency Diet: Yes Eating Assist Level: Swallowing techniques: self managed   Eating Set Up Assist For: Opening containers       Cognition Comprehension Comprehension assist level: Follows complex conversation/direction with no assist  Expression   Expression assist level: Expresses complex 90% of the time/cues < 10% of the time  Social Interaction Social Interaction assist level: Interacts appropriately with others - No medications needed.  Problem Solving Problem solving assist level: Solves complex 90% of the time/cues < 10% of the time  Memory Memory assist level:  Recognizes or recalls 50 - 74% of the time/requires cueing 25 - 49% of the time    Pain Pain Assessment Pain Assessment: No/denies pain Pain Score: 6  Pain Type: Acute pain Pain Location: Neck Pain Orientation: Posterior Pain Descriptors / Indicators: Aching Pain Frequency: Constant Pain Onset: On-going Pain Intervention(s): Medication (See eMAR)  Therapy/Group: Individual Therapy  Sarrah Fiorenza, Kara Pacer 12/08/2014, 3:39 PM

## 2014-12-08 NOTE — Progress Notes (Signed)
Occupational Therapy Session Note  Patient Details  Name: Paul Galloway MRN: [id number] Date of Birth: 04-Aug-1970  Today's Date: 12/08/2014 OT Individual Time: 0700-0756 OT Individual Time Calculation (min): 56 min    Short Term Goals: Week 2:  OT Short Term Goal 1 (Week 2): STGs=LTGs secondary to upcoming discharge  Skilled Therapeutic Interventions/Progress Updates:  Upon entering the room, pt seated in recliner chair with 5/10 c/o pain in neck this session. Pt partially dressed upon entering the room. Skilled OT intervention with focus on self care retraining, endurance, AE education, and functional mobility. Pt requested kinesiotape removal and pt able to don and doff pull over shirt without assistance. Pt utilized long handled reacher and sock aide in order to don and doff B shoes and socks as well. Pt ambulated 150' with RW and supervision to family room to make cup of coffee. Pt seated in day room as he is very anxious regarding upcoming discharge. OT provided education regarding to progress and discharge expectations with pt verbalizing understanding. OT encouraged pt to write important information or questions in memory notebook as session progressed. Pt returning to room in same manner as above. OT also provided pt with toilet aide although pt declined toileting this session. Therapist demonstrated how to don and doff toilet paper from aide. Pt seated in recliner chair with breakfast tray and call bell.   Therapy Documentation Precautions:  Precautions Precautions: Posterior Hip, Cervical Precaution Booklet Issued: No Precaution Comments: pt able to recall 3/3 hip precautions and weight bearing restrictions independently Required Braces or Orthoses: Cervical Brace, Spinal Brace Cervical Brace: Hard collar, At all times Spinal Brace: Other (comment) (CTO) Restrictions Weight Bearing Restrictions: Yes RLE Weight Bearing: Touchdown weight bearing  Function:     Upper Body  Dressing/Undressing Upper body dressing   What is the patient wearing?: Pull over shirt/dress     Pull over shirt/dress - Perfomed by patient: Thread/unthread right sleeve;Thread/unthread left sleeve;Put head through opening;Pull shirt over trunk   Button up shirt - Perfomed by patient: Pull shirt around back;Button/unbutton shirt;Thread/unthread left sleeve;Thread/unthread right sleeve      Upper body assist Assist Level: More than reasonable time       Lower Body Dressing/Undressing Lower body dressing   What is the patient wearing?: Socks;Shoes             Socks - Performed by patient: Don/doff right sock;Don/doff left sock   Shoes - Performed by patient: Don/doff right shoe;Don/doff left shoe            Lower body assist   Assistive Device Comment: long handled reacher, sock aide, shoe horn     Toileting Toileting          Toileting assist      Bed Mobility Roll left and right activity      Sit to lying activity      Lying to sitting activity      Mobility details     Transfers Sit to stand transfer   Sit to stand assist level: No help, no cues, assistive device, takes more than a reasonable amount of time Sit to stand assistive device: Armrests;Walker;Orthosis  Chair/bed Health and safety inspector Comprehension Comprehension assist level: Follows complex conversation/direction with no assist  Expression Expression assist level: Expresses complex 90% of the time/cues < 10% of the time  Social Interaction Social Interaction assist level: Interacts appropriately with others - No medications needed.  Problem Solving Problem solving assist level: Solves complex 90% of the time/cues < 10% of the time  Memory Memory assist level: Recognizes or recalls 50 - 74% of the time/requires cueing 25 - 49% of the time    Therapy/Group: Individual Therapy  Lowella Grip 12/08/2014, 12:51 PM
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
## 2014-12-09 ENCOUNTER — Inpatient Hospital Stay (HOSPITAL_COMMUNITY): Payer: Medicaid Other | Admitting: Occupational Therapy
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
## 2014-12-09 NOTE — Progress Notes (Signed)
Occupational Therapy Session Note  Patient Details  Name: LEVELLE EDELEN MRN: [id number] Date of Birth: [birth year]  Today's Date: 12/09/2014 OT Individual Time: 1430-1515 OT Individual Time Calculation (min): 45 min    Short Term Goals: Week 2:  OT Short Term Goal 1 (Week 2): STGs=LTGs secondary to upcoming discharge  Skilled Therapeutic Interventions/Progress Updates:  Upon entering the room, pt seated in recliner chair with girlfriend and friends present in room. Pt ambulated with RW and mod I 200' to ADL apartment for simulated kitchen tasks at mod I. OT recommended change of placement of microwave as well as set up of kitchen in order to maintain precautions and to decrease fall risk during kitchen mobility and simple meal prep. Pt simulated practice of utilizing microwave on counter as he will have his placed on kitchen table at home. Caregiver with no further questions and pt ambulating back to room with RW and mod I. OT checked "[name]" off as being able to ambulate with pt in hallway as long as RW being utilized. Pt returned to room and seated in recliner chair upon exiting the room.   Therapy Documentation Precautions:  Precautions Precautions: Posterior Hip, Cervical Precaution Booklet Issued: No Precaution Comments: pt able to recall 3/3 hip precautions and weight bearing restrictions independently Required Braces or Orthoses: Cervical Brace, Spinal Brace Cervical Brace: Hard collar, At all times Spinal Brace: Other (comment) (CTO) Restrictions Weight Bearing Restrictions: Yes RLE Weight Bearing: Touchdown weight bearing General:   Vital Signs:   Pain: Pain Assessment Pain Assessment: 0-10 Pain Score: Asleep Pain Type: Acute pain Pain Location: Neck Pain Orientation: Posterior Pain Descriptors / Indicators: Aching Pain Frequency: Constant Pain Onset: On-going Pain Intervention(s): Medication (See eMAR) Function:                Transfers Sit to stand transfer   Sit to stand assist level: No help, no cues, assistive device, takes more than a reasonable amount of time    Chair/bed transfer              Toilet transfer                Tub/shower transfer             Cognition Comprehension Comprehension assist level: Follows complex conversation/direction with extra time/assistive device  Expression Expression assist level: Expresses complex 90% of the time/cues < 10% of the time  Social Interaction Social Interaction assist level: Interacts appropriately with others with medication or extra time (anti-anxiety, antidepressant).  Problem Solving Problem solving assist level: Solves complex 90% of the time/cues < 10% of the time  Memory Memory assist level: Recognizes or recalls 75 - 89% of the time/requires cueing 10 - 24% of the time    Therapy/Group: Individual Therapy  Lowella Grip 12/09/2014, 3:27 PM

## 2014-12-10 ENCOUNTER — Encounter (HOSPITAL_COMMUNITY): Payer: Self-pay

## 2014-12-10 ENCOUNTER — Inpatient Hospital Stay (HOSPITAL_COMMUNITY): Payer: Medicaid Other | Admitting: Physical Therapy

## 2014-12-10 ENCOUNTER — Inpatient Hospital Stay (HOSPITAL_COMMUNITY): Payer: Medicaid Other

## 2014-12-10 ENCOUNTER — Inpatient Hospital Stay (HOSPITAL_COMMUNITY): Payer: Medicaid Other | Admitting: Speech Pathology

## 2014-12-10 NOTE — Progress Notes (Signed)
Physical Therapy Session Note  Patient Details  Name: Paul Galloway MRN: [id number] Date of Birth: 1970/10/02  Today's Date: 12/10/2014 PT Individual Time: 1000-1100 PT Individual Time Calculation (min): 60 min   Short Term Goals: Week 2:  PT Short Term Goal 1 (Week 2): STGs = LTGs due to LOS  Skilled Therapeutic Interventions/Progress Updates:    PT and patient discussed d/c planning, methods for staying oriented at home, especially during the night, methods for managing his dog, etc.  PT educated patient in negotiation of steps with R hand rail only. Pt ascended/descended 3 times with steady A with rest break after 2 trials. PT instructed patient in BLE therex x20 reps: heel/toe raises, LAQ, hip add with deflated beach ball, and hip abd with tangerine theraband. See below for transfer details.   Therapy Documentation Precautions:  Precautions Precautions: Posterior Hip, Cervical Precaution Booklet Issued: No Precaution Comments: pt able to recall 3/3 hip precautions and weight bearing restrictions independently Required Braces or Orthoses: Cervical Brace, Spinal Brace Cervical Brace: Hard collar, At all times Spinal Brace: Other (comment) (CTO) Restrictions Weight Bearing Restrictions: Yes RLE Weight Bearing: Touchdown weight bearing  Pain: Pain Assessment Pain Assessment: 0-10 Pain Score: 7  Pain Location: Hip Pain Orientation: Right Pain Intervention(s): Emotional support   Function:   Bed Mobility Roll left and right activity Roll left and right activity did not occur:  (pt received in recliner and positioned in recliner at end of session)    Sit to lying activity      Lying to sitting activity      Mobility details     Transfers Sit to stand transfer   Sit to stand assist level: No help, no cues, assistive device, takes more than a reasonable amount of time Sit to stand assistive device: Walker;Orthosis;Armrests  Chair/bed transfer   Chair/bed transfer  method: Ambulatory Chair/bed transfer assist level: No Help, no cues, assistive device, takes more than a reasonable amount of time Chair/bed transfer assistive device: Armrests;Walker;Orthosis       Corporate investment banker     Max distance: 150 Assist level: No help, No cues, assistive device, takes more than a reasonable amount of time  Walk 10 feet activity   Assist level: No help, No cues, assistive device, takes more than a reasonable amount of time  Walk 50 feet with 2 turns activity   Assist level: No help, No cues, assistive device, takes more than a reasonable amount of time  Walk 150 feet activity   Assist level: No help, No cues, assistive device, takes more than a reasonable amount of time  Walk 10 feet on uneven surfaces activity      Stairs   Stairs assistive device: 1 hand rail Max number of stairs: 8 Stairs assist level: Touching or steadying assistance (Pt > 75%)  Walk up/down 1 step activity     Walk up/down 1 step (curb) assist level: Touching or steadying assistance (Pt > 75%)  Walk up/down 4 steps activity   Walk up/down 4 steps assist level: Touching or steadying assistance (Pt > 75%)  Walk up/down 12 steps activity      Pick up small objects from floor Pick up small object from the floor (from standing position) activity did not occur: Safety/medical concerns (posterior hip precautions and spinal precautions)    Wheelchair  Wheel 50 feet with 2 turns activity      Wheel 150 feet activity          Therapy/Group: Individual Therapy  Ladora Daniel Penven-Crew 12/10/2014, 12:08 PM

## 2014-12-10 NOTE — Progress Notes (Signed)
Occupational Therapy Session Note  Patient Details  Name: Paul Galloway MRN: [id number] Date of Birth: 10-12-1970  Today's Date: 12/10/2014 OT Individual Time: 1100-1200 OT Individual Time Calculation (min): 60 min    Short Term Goals: Week 2:  OT Short Term Goal 1 (Week 2): STGs=LTGs secondary to upcoming discharge  Skilled Therapeutic Interventions/Progress Updates:    Pt resting in recliner upon arrival.  Pt stated he had already "freshened up" and changed clothes.  Pt requested to use urinal and stood to void.  Pt stood at sink to wash hands prior to amb with RW to therapy gym, ADL apartment, and tub room.  Pt was able to recall 3/3 hip precautions, in addition to weight bearing restrictions.  Pt issued walker bag.  Pt did not exhibit any unsafe behaviors during home mgmt tasks or kitchen tasks.  Discussed bathing arrangements at home and problem solved options.  Pt states he thinks a friend has a tub bench to use after discharge.  Pt also engaged in dynamic standing tasks prior to amb back to room.  Focus on safety awareness, discharge planning, functional amb with RW, standing balance, and activity tolerance to increase independence with BADLs and IADLs.  Therapy Documentation Precautions:  Precautions Precautions: Posterior Hip, Cervical Precaution Booklet Issued: No Precaution Comments: pt able to recall 3/3 hip precautions and weight bearing restrictions independently Required Braces or Orthoses: Cervical Brace, Spinal Brace Cervical Brace: Hard collar, At all times Spinal Brace: Other (comment) (CTO) Restrictions Weight Bearing Restrictions: Yes RLE Weight Bearing: Touchdown weight bearing Pain: Pain Assessment Pain Assessment: 0-10 Pain Score: 7  Pain Location: Hip Pain Orientation: Right Pain Intervention(s): Emotional support     Therapy/Group: Individual Therapy  Paul Galloway, Paul Galloway 12/10/2014, 12:30 PM

## 2014-12-10 NOTE — Progress Notes (Signed)
At 0220 notified staff that he removed part of brace. One side of brace unfastened-"Fix it, I'm terrified to move"! Reports seeing another man with brace in room. He said they were racing to see who could remove brace the fastest. Continues to take Oxy IR every 4hours for complaint of RLE pain. Sleeps in recliner. Alfredo Martinez A

## 2014-12-10 NOTE — Plan of Care (Signed)
Problem: RH PAIN MANAGEMENT Goal: RH STG PAIN MANAGED AT OR BELOW PT'S PAIN GOAL <4  Outcome: Not Progressing Consistently scoring pain a 7 or higher.
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
## 2014-12-10 NOTE — Progress Notes (Signed)
Speech Language Pathology Daily Session Note  Patient Details  Name: Paul Galloway MRN: [id number] Date of Birth: [birth year]  Today's Date: 12/10/2014 SLP Individual Time: 1400-1430 SLP Individual Time Calculation (min): 30 min  Short Term Goals: Week 1: SLP Short Term Goal 1 (Week 1): Pt will utilize recommended swallowing strategies with Mod I SLP Short Term Goal 2 (Week 1): Pt will consume trials of advanced solids with minimal overt signs of aspiration with Min A  Skilled Therapeutic Interventions: Skilled treatment session focused on dysphagia goals. SLP facilitated session with a functional conversation in regards to patient's current swallowing function.  Patient reported that the Dys. 1 textures are going down "great" and that he is "shoveling it in just to see what would happen" without overt s/s of aspiration. Patient also reported that he received "chopped" food on Sunday and that he consumed the meal without "any problems" and requested to upgrade to Dys. 2 textures. Recommend patient upgrade to Dys. 2 textures.  SLP also educated patient on strategies to utilize when patient is experiencing decreased word-finding. Patient verbalized understanding of all information. Patient left upright in recliner with all needs within reach. Continue with current plan of care.    Function:    Cognition Comprehension Comprehension assist level: Follows complex conversation/direction with extra time/assistive device  Expression   Expression assist level: Expresses complex 90% of the time/cues < 10% of the time  Social Interaction Social Interaction assist level: Interacts appropriately with others with medication or extra time (anti-anxiety, antidepressant).  Problem Solving Problem solving assist level: Solves basic problems with no assist  Memory Memory assist level: Recognizes or recalls 75 - 89% of the time/requires cueing 10 - 24% of the time    Pain Patient reported right hip pain,  patient pre-medicated and repositioned    Therapy/Group: Individual Therapy  Alexi Geibel 12/10/2014, 3:52 PM

## 2014-12-10 NOTE — Progress Notes (Signed)
Physical Therapy Session Note  Patient Details  Name: Paul Galloway MRN: [id number] Date of Birth: Sep 13, 1970  Today's Date: 12/10/2014 PT Concurrent Time: 1300-1400 PT Concurrent Time Calculation (min): 60 min  Short Term Goals: Week 2:  PT Short Term Goal 1 (Week 2): STGs = LTGs due to LOS  Skilled Therapeutic Interventions/Progress Updates:    Pt received in recliner for concurrent session agreeable to therapy. Pt ambulated to/from gym with mod I using RW. PT instructed patient in negotiation of 8 steps with R hand rail with supervision. Pt maintained weight bearing status and spinal precautions on stairs without cues.  PT administered TUG (avg time 17.24 seconds with RW) pt requiring only verbal cues for spinal precautions with turn to sit down. Pt returned to room and made mod I in room. SLP in for speech therapy session as PT leaving.   Therapy Documentation Precautions:  Precautions Precautions: Posterior Hip, Cervical Precaution Booklet Issued: No Precaution Comments: pt able to recall 3/3 hip precautions and weight bearing restrictions independently Required Braces or Orthoses: Cervical Brace, Spinal Brace Cervical Brace: Hard collar, At all times Spinal Brace: Other (comment) (CTO) Restrictions Weight Bearing Restrictions: Yes RLE Weight Bearing: Touchdown weight bearing  Pain: Pain Assessment Pain Score: 2    Function:   Transfers Sit to stand transfer   Sit to stand assist level: No help, no cues, assistive device, takes more than a reasonable amount of time Sit to stand assistive device: Orthosis;Walker;Armrests  Chair/bed transfer   Chair/bed transfer method: Ambulatory Chair/bed transfer assist level: No Help, no cues, assistive device, takes more than a reasonable amount of time Chair/bed transfer assistive device: Armrests;Walker;Orthosis       Scientist, physiological device:  Walker-rolling Max distance: 300 Assist level: No help, No cues, assistive device, takes more than a reasonable amount of time  Walk 10 feet activity   Assist level: No help, No cues, assistive device, takes more than a reasonable amount of time  Walk 50 feet with 2 turns activity   Assist level: No help, No cues, assistive device, takes more than a reasonable amount of time  Walk 150 feet activity   Assist level: No help, No cues, assistive device, takes more than a reasonable amount of time  Walk 10 feet on uneven surfaces activity   Assist level: No help, No cues, assistive device, takes more than a reasonable amount of time  Stairs   Stairs assistive device: 1 hand rail Max number of stairs: 8 Stairs assist level: Supervision or verbal cues  Walk up/down 1 step activity     Walk up/down 1 step (curb) assist level: Supervision or verbal cues  Walk up/down 4 steps activity   Walk up/down 4 steps assist level: Supervision or verbal cues  Walk up/down 12 steps activity      Pick up small objects from floor Pick up small object from the floor (from standing position) activity did not occur: Safety/medical concerns (posterior hip precautions and spinal precautions)    Wheelchair          Wheel 50 feet with 2 turns activity      Wheel 150 feet activity       Cognition Comprehension Comprehension assist level: Follows complex conversation/direction with extra time/assistive device  Expression Expression assist level: Expresses complex 90% of  the time/cues < 10% of the time  Social Interaction Social Interaction assist level: Interacts appropriately with others with medication or extra time (anti-anxiety, antidepressant).  Problem Solving Problem solving assist level: Solves basic problems with no assist  Memory Memory assist level: Recognizes or recalls 75 - 89% of the time/requires cueing 10 - 24% of the time    Therapy/Group: concurrent  Hadli Vandemark E Penven-Crew 12/10/2014, 4:39  PM

## 2014-12-11 ENCOUNTER — Inpatient Hospital Stay (HOSPITAL_COMMUNITY): Payer: Medicaid Other | Admitting: Physical Therapy

## 2014-12-11 ENCOUNTER — Inpatient Hospital Stay (HOSPITAL_COMMUNITY): Payer: Self-pay

## 2014-12-11 ENCOUNTER — Inpatient Hospital Stay (HOSPITAL_COMMUNITY): Payer: Medicaid Other | Admitting: Speech Pathology

## 2014-12-11 NOTE — Progress Notes (Signed)
Occupational Therapy Discharge Summary  Patient Details  Name: Paul Galloway MRN: [id number] Date of Birth: July 21, 1970   Patient has met 9 of 9 long term goals due to improved activity tolerance, improved balance, ability to compensate for deficits, improved attention, improved awareness and improved coordination.  Pt made steady progess with BADLs and IADLs during this admission.  Pt is mod I with all BADLs and simple meal prep.  Pt recalls back and hip precautions independently.  Pt does not want to learn how to change pads on orthosis. Family/friends have not been present for OT sessions. Patient to discharge at overall Modified Independent level.  Patient's care partner is independent to provide the necessary  assistance at discharge.      Recommendation:  No further OT services needed at this time.  Equipment: No equipment provided Pt states he has friend who will provide tub bench.  Reasons for discharge: treatment goals met and discharge from hospital  Patient/family agrees with progress made and goals achieved: Yes  OT Discharge     Vision/Perception  Vision- History Baseline Vision/History: No visual deficits Patient Visual Report: Eye fatigue/eye pain/headache Vision- Assessment Vision Assessment?: No apparent visual deficits  Cognition Overall Cognitive Status: Within Functional Limits for tasks assessed Arousal/Alertness: Awake/alert Orientation Level: Oriented X4 Attention: Selective;Alternating Selective Attention: Appears intact Alternating Attention: Appears intact Memory: Appears intact Memory Impairment: Decreased recall of new information Awareness: Appears intact Problem Solving: Appears intact Safety/Judgment: Appears intact Comments: occasional need for verbal cues for spinal precautions (no twisting) Sensation Sensation Light Touch: Appears Intact Stereognosis: Not tested Hot/Cold: Appears Intact Proprioception: Appears  Intact Coordination Gross Motor Movements are Fluid and Coordinated: Yes Fine Motor Movements are Fluid and Coordinated: Yes Finger Nose Finger Test: normal Heel Shin Test: normal on LLE, RLE limited 2/2 posterior hip precautions Motor  Motor Motor: Abnormal postural alignment and control;Other (comment) Mobility  Bed Mobility Bed Mobility: Supine to Sit;Sit to Supine Supine to Sit: 6: Modified independent (Device/Increase time) Sit to Supine: 6: Modified independent (Device/Increase time) Transfers Sit to Stand: 6: Modified independent (Device/Increase time) Stand to Sit: 6: Modified independent (Device/Increase time)  Trunk/Postural Assessment  Cervical Assessment Cervical Assessment: Exceptions to [facility] (cervical orthosis) Thoracic Assessment Thoracic Assessment: Exceptions to [facility] (thoracic orthosis) Lumbar Assessment Lumbar Assessment: Exceptions to [facility] (lumbar otrhosis)  Balance Balance Balance Assessed: Yes Dynamic Sitting Balance Dynamic Sitting - Balance Support: Feet supported;No upper extremity supported Dynamic Sitting - Level of Assistance: 6: Modified independent (Device/Increase time) Dynamic Sitting - Balance Activities: Lateral lean/weight shifting;Reaching for objects Static Standing Balance Static Standing - Balance Support: During functional activity Static Standing - Level of Assistance: 6: Modified independent (Device/Increase time) Dynamic Standing Balance Dynamic Standing - Balance Support: During functional activity Dynamic Standing - Level of Assistance: 6: Modified independent (Device/Increase time) Extremity/Trunk Assessment RUE Assessment RUE Assessment: Within Functional Limits LUE Assessment LUE Assessment: Within Functional Limits     Panayiotis, [place] 12/11/2014, 2:23 PM
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
## 2014-12-11 NOTE — Progress Notes (Signed)
Speech Language Pathology Discharge Summary  Patient Details  Name: Paul Galloway MRN: [id number] Date of Birth: 20-Jan-1971  Today's Date: 12/11/2014 SLP Individual Time: 0730-0800 SLP Individual Time Calculation (min): 30 min   Skilled Therapeutic Interventions:  Skilled treatment session focused on dysphagia goals. SLP facilitated session by providing skilled observation with breakfast meal of Dys. 2 textures with thin liquids. Patient required intermittent verbal cues for use of small bites and demonstrated overt cough X 1, suspect due to speaking with a full oral cavity. Recommend patient continue current diet. SLP also observed patient taking his medications whole with thin liquids via straw without overt s/s of aspiration. Patient was educated in regards to his current swallowing function, diet recommendations and appropriate textures, handouts were also given to reinforce information. He verbalized understanding of all information. Patient would benefit from f/u SLP services to maximize his swallowing function.    Patient has met 1 of 1 long term goals.  Patient to discharge at overall Modified Independent level.   Reasons goals not met: N/A    Clinical Impression/Discharge Summary: Patient has made functional gains and has met 1of 1 LTG's this admission due to increased swallowing function.  Currently, patient is consuming Dys. 2 textures with thin liquids with minimal overt s/s of aspiration and is Mod I for use of swallowing compensatory strategies.  Patient education is complete and patient will discharge home with assistance from friends and family. Patient would benefit from f/u SLP services to maximize swallowing safety and reduce caregiver burden.    Care Partner:  Caregiver Able to Provide Assistance: Yes     Recommendation:  Home Health SLP  Rationale for SLP Follow Up: Reduce caregiver burden;Maximize swallowing safety   Equipment: N/A    Reasons for discharge:  Treatment goals met;Discharged from hospital   Patient/Family Agrees with Progress Made and Goals Achieved: Yes   Function:  Eating Eating   Modified Consistency Diet: Yes Eating Assist Level: Swallowing techniques: self managed           Cognition Comprehension Comprehension assist level: Follows basic conversation/direction with no assist  Expression   Expression assist level: Expresses complex 90% of the time/cues < 10% of the time  Social Interaction Social Interaction assist level: Interacts appropriately with others with medication or extra time (anti-anxiety, antidepressant).  Problem Solving Problem solving assist level: Solves basic problems with no assist  Memory Memory assist level: Recognizes or recalls 75 - 89% of the time/requires cueing 10 - 24% of the time   Yanelli Zapanta 12/11/2014, 2:16 PM

## 2014-12-11 NOTE — Progress Notes (Signed)
Occupational Therapy Session Note  Patient Details  Name: Paul Galloway MRN: [id number] Date of Birth: 1970/09/03  Today's Date: 12/11/2014 OT Individual Time: 0800-0900 OT Individual Time Calculation (min): 60 min    Short Term Goals: Week 2:  OT Short Term Goal 1 (Week 2): STGs=LTGs secondary to upcoming discharge  Skilled Therapeutic Interventions/Progress Updates:    Pt resting in recliner upon arrival.  Per self report with verification by staff, pt completed all bathing and dressing tasks at mod I level this morning.  Pt requested to use toilet and amb with RW to bathroom and completed all tasks at mod I level.  Pt performed simulated tub bench transfer.  Pt stated he thought he had access to tub bench after discharge.  Discussed changing of CTO pads.  Pt stated he didn't feel comfortable with removal of orthosis to change pads.  OT explained to pt that the procedure takes place with him stationary while supine in bed.  Pt continued to verbalize preference.  OT will discuss with RN to provide training for pt's friend.  Focus on continued education, functional amb with RW, safety awareness, and activity tolerance.  Therapy Documentation Precautions:  Precautions Precautions: Posterior Hip, Cervical Precaution Booklet Issued: No Precaution Comments: pt able to recall 3/3 hip precautions and weight bearing restrictions independently Required Braces or Orthoses: Cervical Brace, Spinal Brace Cervical Brace: Hard collar, At all times Spinal Brace: Other (comment) (CTO) Restrictions Weight Bearing Restrictions: Yes RLE Weight Bearing: Touchdown weight bearing Pain: Pain Assessment Pain Assessment: 0-10 Pain Score: 7  Pain Type: Acute pain Pain Location: Neck Pain Descriptors / Indicators: Shooting Pain Frequency: Constant Pain Onset: On-going Pain Intervention(s): RN aware; premedicated  Function:   Eating Eating   Eating Assist Level: Swallowing techniques: self managed            Grooming Oral Care,Brush Teeth, Clean Dentures Activity:      Assist Level: No help, No cues      Wash, Rinse, Dry Face Activity   Assist Level: No help, No cues      Wash, Rinse, Dry Hands Activity   Assist Level: No help, No cues      Brush, Comb Hair Activity   Assist Level: No help, No cues    Shave Activity Shave activity did not occur: Patient does not shave        Apply Makeup Activity Apply makeup activity did not occur: Patient does not wear makeup                                                           Bathing Bathing position   Position: Wheelchair/chair at sink  Bathing parts Body parts bathed by patient: Right arm;Left arm;Chest;Abdomen;Front perineal area;Buttocks;Right upper leg;Left upper leg;Right lower leg Body parts bathed by helper: Back  Bathing assist Assist Level: Assistive device;More than reasonable time       Upper Body Dressing/Undressing Upper body dressing   What is the patient wearing?: Pull over shirt/dress     Pull over shirt/dress - Perfomed by patient: Thread/unthread right sleeve;Thread/unthread left sleeve;Put head through opening;Pull shirt over trunk          Upper body assist Assist Level: More than reasonable time       Lower Body Dressing/Undressing Lower body dressing  What is the patient wearing?: Pants;Shoes   Underwear - Performed by helper: Thread/unthread right underwear leg;Thread/unthread left underwear leg;Pull underwear up/down         Socks - Performed by patient:  (does not wear socks)   Shoes - Performed by patient: Don/doff right shoe;Don/doff left shoe (elastic shoe laces)            Lower body assist Assist Level: More than reasonable time;Assistive device Assistive Device Comment: long handle reacher, sock aide, shoe horn     Toileting Toileting   Toileting steps completed by patient: Adjust clothing prior to toileting;Performs perineal hygiene;Adjust clothing after  toileting   Toileting Assistive Devices: Grab bar or rail  Toileting assist Assist level: More than reasonable time      Transfers       Toilet transfer   Toilet transfer assistive device: Elevated toilet seat/BSC over toilet;Walker       Assist level to toilet: No Help, no cues, assistive device, takes more than a reasonable amount of time Assist level from toilet: No Help, no cues, assistive device, takes more than a reasonable amount of time  Tub/shower transfer Tub/shower transfer activity did not occur: Simulated Tub/shower assistive device: Tub transfer bench;Walker;Grab bars   Assist level into tub: Supervision or verbal cues Assist level out of tub: Supervision or verbal cues     Therapy/Group: Individual Therapy  [place], Stiggers 12/11/2014, 9:01 AM

## 2014-12-11 NOTE — Progress Notes (Signed)
Physical Therapy Discharge Summary  Patient Details  Name: Paul Galloway MRN: [id number] Date of Birth: 11-16-1970  Today's Date: 12/11/2014 PT Individual Time: 1100-1200 PT Individual Time Calculation (min): 60 min   Session 2 Individual Time: 1500-1600 Session 2 Time Calculation: 60 min   Patient has met 9 of 9 long term goals due to improved activity tolerance, improved balance, improved postural control, increased strength, decreased pain, ability to compensate for deficits and improved awareness.  Patient to discharge at an ambulatory level Modified Independent.   Patient's care partner independent to provide the necessary supervision for car transfers and stair negotiation at discharge.  Recommendation:  Patient will benefit from ongoing skilled PT services in home health setting to continue to advance safe functional mobility, address ongoing impairments in ROM, strength, activity tolerance, balance, safety awareness, and minimize fall risk.  Equipment: No equipment provided  Reasons for discharge: treatment goals met  Patient/family agrees with progress made and goals achieved: Yes   Skilled PT Intervention:  Session 1: Pt instructed patient in functional transfers and gait activities (see details below). Pt performing mobility with mod I, except car transfers and stairs with supervision and verbal cues for sequencing and maintenance of precautions. PT discussed d/c plans with patient and problem solved with patient tasks that will be difficult at d/c (i.e. Walking the dog).   Session 2: focus on dynamic sitting and standing balance while adhering to weight bearing restrictions, posterior hip precautions, and spinal precautions. Pt demonstrates no LOB during dynamic task (Wii boxing, bowling, and tennis) in sitting or standing with RW, but requires occasional verbal cues for maintaining precautions and weight bearing restriction. PT discussed with pt and significant other [name])  d/c home tomorrow. All questions answered. Provided patient with tangerine theraband and reviewed appropriate exercises to complete at home. Pt and [name] verbalized understanding. Pt ambulated back to room with [name].   PT Discharge Precautions/Restrictions Precautions Precautions: Posterior Hip;Cervical Required Braces or Orthoses: Cervical Brace;Spinal Brace Cervical Brace: Hard collar;At all times Spinal Brace:  (CTO) Restrictions Weight Bearing Restrictions: Yes RLE Weight Bearing: Touchdown weight bearing  Pain Pain Assessment Pain Assessment: 0-10 Pain Score: 7  Pain Location: Neck (neck and sternum) Pain Intervention(s): Emotional support   Cognition Overall Cognitive Status: Within Functional Limits for tasks assessed Arousal/Alertness: Awake/alert Orientation Level: Oriented X4 Memory Impairment: Decreased recall of new information Awareness: Appears intact Problem Solving: Appears intact Comments: occasional need for verbal cues for spinal precautions (no twisting) Sensation Sensation Light Touch: Appears Intact Coordination Gross Motor Movements are Fluid and Coordinated: Yes Fine Motor Movements are Fluid and Coordinated: Yes Finger Nose Finger Test: normal Heel Shin Test: normal on LLE, RLE limited 2/2 posterior hip precautions   Mobility Bed Mobility Bed Mobility: Supine to Sit;Sit to Supine Supine to Sit: 6: Modified independent (Device/Increase time) Sit to Supine: 6: Modified independent (Device/Increase time) Transfers Transfers: Yes Sit to Stand: 6: Modified independent (Device/Increase time) Stand to Sit: 6: Modified independent (Device/Increase time) Stand Pivot Transfers: 6: Modified independent (Device/Increase time) Locomotion  Ambulation Ambulation: Yes Ambulation/Gait Assistance: 6: Modified independent (Device/Increase time) Ambulation Distance (Feet): 200 Feet Assistive device: Rolling walker Gait Gait: Yes Gait Pattern:  Step-through pattern;Decreased step length - left;Decreased stance time - right;Decreased weight shift to right Stairs / Additional Locomotion Stairs: Yes Stairs Assistance: 5: Supervision Stair Management Technique: One rail Right;Forwards Number of Stairs: 12 Height of Stairs: 6 Wheelchair Mobility Wheelchair Mobility: No (pt ambulatory on unit, will only need w/c for community  ambulation >350 feet)   Balance Balance Balance Assessed: Yes Dynamic Sitting Balance Dynamic Sitting - Balance Support: Feet supported;No upper extremity supported Dynamic Sitting - Level of Assistance: 6: Modified independent (Device/Increase time) Dynamic Sitting - Balance Activities: Lateral lean/weight shifting;Reaching for objects Static Standing Balance Static Standing - Balance Support: During functional activity Static Standing - Level of Assistance: 6: Modified independent (Device/Increase time) Dynamic Standing Balance Dynamic Standing - Balance Support: During functional activity Dynamic Standing - Level of Assistance: 6: Modified independent (Device/Increase time) Extremity Assessment  RLE Assessment RLE Assessment:  (grossly 3+/5, unable to formally assess 2/2 posterior hip precautions and weight bearing restriction) LLE Assessment LLE Assessment: Within Functional Limits (5/5 hip flex, knee flexion, knee extension)  Function:  Herbalist Devices: Grab bar or rail   Bed Mobility Roll left and right activity   Assist level: No help, No cues, assistive device, takes more than a reasonable amount of time  Sit to lying activity   Assist level: No help, No cues, assistive device, takes more than a reasonable amount of time  Lying to sitting activity   Assist level: No help, No cues, assistive device, takes more than a reasonable amount of time  Mobility details     Transfers Sit to stand transfer   Sit to stand assist level: No help, no cues, assistive  device, takes more than a reasonable amount of time Sit to stand assistive device: Walker;Orthosis;Armrests  Chair/bed transfer   Chair/bed transfer method: Ambulatory Chair/bed transfer assist level: No Help, no cues, assistive device, takes more than a reasonable amount of time Chair/bed transfer assistive device: Museum/gallery conservator transfer assistive device: Elevated toilet seat/BSC over toilet;Walker    Geneticist, molecular transfer assistive device: Copywriter, advertising transfer assist level: Supervision or Lawyer device: Walker-rolling Max distance: 300 Assist level: No help, No cues, assistive device, takes more than a reasonable amount of time  Walk 10 feet activity   Assist level: No help, No cues, assistive device, takes more than a reasonable amount of time  Walk 50 feet with 2 turns activity   Assist level: No help, No cues, assistive device, takes more than a reasonable amount of time  Walk 150 feet activity   Assist level: No help, No cues, assistive device, takes more than a reasonable amount of time  Walk 10 feet on uneven surfaces activity   Assist level: No help, No cues, assistive device, takes more than a reasonable amount of time  Stairs   Stairs assistive device: 1 hand rail Max number of stairs: 12 Stairs assist level: Supervision or verbal cues  Walk up/down 1 step activity     Walk up/down 1 step (curb) assist level: Supervision or verbal cues  Walk up/down 4 steps activity   Walk up/down 4 steps assist level: Supervision or verbal cues  Walk up/down 12 steps activity   Walk up/down 12 steps assist level: Supervision or verbal cues  Pick up small objects from floor Pick up small object from the floor (from standing position) activity did not occur: Safety/medical concerns (spinal precautions and posterior hip precautions)    Wheelchair Wheelchair activity did not occur:  (pt ambulating  on unit, will use w/c for community mobility >350 feet) Type: Manual      Wheel 50 feet with 2 turns activity      Wheel  150 feet activity         Nyriah Coote E Penven-Crew 12/11/2014, 12:27 PM

## 2014-12-12 DIAGNOSIS — K5903 Drug induced constipation: Secondary | ICD-10-CM | POA: Diagnosis present

## 2014-12-12 DIAGNOSIS — T402X5A Adverse effect of other opioids, initial encounter: Secondary | ICD-10-CM | POA: Diagnosis present

## 2014-12-12 DIAGNOSIS — R1319 Other dysphagia: Secondary | ICD-10-CM | POA: Diagnosis present

## 2014-12-12 DIAGNOSIS — H9202 Otalgia, left ear: Secondary | ICD-10-CM | POA: Diagnosis present

## 2014-12-12 DIAGNOSIS — F411 Generalized anxiety disorder: Secondary | ICD-10-CM | POA: Diagnosis present

## 2014-12-12 MED ORDER — METHOCARBAMOL 750 MG PO TABS: 750.0000 mg | ORAL_TABLET | Freq: Four times a day (QID) | ORAL | Status: DC

## 2014-12-12 MED ORDER — OXYCODONE HCL 10 MG PO TABS: 10.0000 mg | ORAL_TABLET | Freq: Four times a day (QID) | ORAL | Status: DC | PRN

## 2014-12-12 MED ORDER — LORATADINE 10 MG PO TABS: 10.0000 mg | ORAL_TABLET | Freq: Every day | ORAL | Status: DC

## 2014-12-12 MED ORDER — CLONAZEPAM 0.5 MG PO TABS: 0.5000 mg | ORAL_TABLET | Freq: Three times a day (TID) | ORAL | Status: DC | PRN

## 2014-12-12 MED ORDER — OXYCODONE HCL 5 MG PO TABS: 20.0000 mg | ORAL_TABLET | Freq: Once | ORAL | Status: DC

## 2014-12-12 MED ORDER — RIVAROXABAN 20 MG PO TABS: 20.0000 mg | ORAL_TABLET | Freq: Every day | ORAL | Status: DC

## 2014-12-12 MED ORDER — GABAPENTIN 100 MG PO CAPS: 200.0000 mg | ORAL_CAPSULE | Freq: Three times a day (TID) | ORAL | Status: DC

## 2014-12-12 MED ORDER — FLUTICASONE PROPIONATE 50 MCG/ACT NA SUSP: 1.0000 | Freq: Every day | NASAL | Status: DC

## 2014-12-12 MED ORDER — POLYETHYLENE GLYCOL 3350 17 G PO PACK: 17.0000 g | PACK | Freq: Every day | ORAL | Status: DC

## 2014-12-12 MED ORDER — METOPROLOL TARTRATE 25 MG PO TABS: 25.0000 mg | ORAL_TABLET | Freq: Two times a day (BID) | ORAL | Status: AC

## 2014-12-12 MED ORDER — RIVAROXABAN 15 MG PO TABS: 15.0000 mg | ORAL_TABLET | Freq: Two times a day (BID) | ORAL | Status: DC

## 2014-12-12 MED ORDER — MORPHINE SULFATE ER 15 MG PO TBCR: EXTENDED_RELEASE_TABLET | ORAL | Status: DC

## 2014-12-12 MED ORDER — PANTOPRAZOLE SODIUM 40 MG PO TBEC: 40.0000 mg | DELAYED_RELEASE_TABLET | Freq: Every day | ORAL | Status: DC
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
## 2014-12-12 NOTE — Progress Notes (Signed)
Pt discharged to home at 1230. Discharge instructions given to pt and family with verbal understanding.

## 2014-12-12 NOTE — Patient Care Conference (Signed)
Inpatient RehabilitationTeam Conference and Plan of Care Update Date: 12/11/2014   Time: 2:35 PM    Patient Name: Paul Galloway      Medical Record Number: [id number]  Date of Birth: 03/08/1971 Sex: Male         Room/Bed: 4W24C/4W24C-01 Payor Info: Payor: MED PAY / Plan: MED PAY ASSURANCE / Product Type: *No Product type* /    Admitting Diagnosis: mild concussion  R Femur fx c7 fx   Admit Date/Time:  11/28/2014  7:05 PM Admission Comments: No comment available   Primary Diagnosis:  Right acetabular fracture Principal Problem: Right acetabular fracture  Patient Active Problem List   Diagnosis Date Noted  . Acute deep vein thrombosis (DVT) of distal vein of right lower extremity 12/06/2014  . DVT, lower extremity   . Trauma 11/28/2014  . Closed cervical spine fracture 11/23/2014  . C2 cervical fracture 11/22/2014  . Lip laceration 11/22/2014  . Multiple abrasions 11/22/2014  . Sternal fracture 11/22/2014  . Right acetabular fracture 11/22/2014  . Bilateral pulmonary contusion 11/22/2014  . Tobacco use disorder 11/13/2014  . Facial lesion 11/13/2014  . HTN (hypertension), malignant 11/05/2014  . SOB (shortness of breath) 11/05/2014    Expected Discharge Date: Expected Discharge Date: 12/12/14  Team Members Present: Physician leading conference: Dr. Alger Simons Social Worker Present: Lennart Pall, LCSW Nurse Present: Other (comment) Verdis Frederickson, RN) PT Present: Raylene Everts, PT;Other (comment) Dwyane Dee, PT) OT Present: Roanna Epley, Jack, OT SLP Present: Weston Anna, SLP PPS Coordinator present : Daiva Nakayama, RN, CRRN     Current Status/Progress Goal Weekly Team Focus  Medical   improved pain control. wounds better. dysphagia improving. sleep an issue as well as anxiety  improve activity tolernance  pain, dvt rx--   Bowel/Bladder   Continent of bowel and bladder. LBM 12/10/14  Pt to remain continent of bowel and bladder  Monitor    Swallow/Nutrition/ Hydration   Dys. 2 textures with thin liquids, Mod I  Mod I  Education complete, Patient will d/c home tomorrow    ADL's   mod I overall  mod I overall  education; discharge   Mobility   mod I all mobility, supervision for stairs/car transfers  mod I for all mobility, supervision stairs/car transfers  grad day Tues, d/c Wed   Communication             Safety/Cognition/ Behavioral Observations            Pain   Oxy IR 57m q 4hrs, MS Contin scheduled BID, Scheduled Robaxin 7524m <8  Manged pain with decrease narcotics   Skin   R knee wound healing, dressing to area. R lower leg wounds CDI, OTA, R index finger laceration with steri and adhesive bandage, BLE edeamous, R>L   No additional skin breakdown  Monitor for s/s of infection, change dressing as ordered    Rehab Goals Patient on target to meet rehab goals: Yes *See Care Plan and progress notes for long and short-term goals.  Barriers to Discharge: med mgt, pain control, anxiety    Possible Resolutions to Barriers:  education, pain rx    Discharge Planning/Teaching Needs:  plan home with only intermittent assist of girlfriend      Team Discussion:  Team reports pt ready for d/c tomorrow and has met all needs.  Education completed.  Follow up and meds discussed.  Revisions to Treatment Plan:  None   Continued Need for Acute Rehabilitation Level of Care: The  patient requires daily medical management by a physician with specialized training in physical medicine and rehabilitation for the following conditions: Daily direction of a multidisciplinary physical rehabilitation program to ensure safe treatment while eliciting the highest outcome that is of practical value to the patient.: Yes Daily medical management of patient stability for increased activity during participation in an intensive rehabilitation regime.: Yes Daily analysis of laboratory values and/or radiology reports with any subsequent need  for medication adjustment of medical intervention for : Neurological problems;Pulmonary problems  Chirstopher Iovino 12/12/2014, 10:56 AM

## 2014-12-12 NOTE — Plan of Care (Signed)
Problem: RH PAIN MANAGEMENT Goal: RH OTHER STG PAIN MANAGEMENT GOALS W/ASSIST Other STG Pain Management Goals With min Assistance.  Outcome: Not Progressing Rates pain at 7-8. Oxy IR 20 mg given prn. Adm

## 2014-12-12 NOTE — Assessment & Plan Note (Signed)
Neurontin  tid

## 2014-12-12 NOTE — Progress Notes (Signed)
Social Work  Discharge Note  The overall goal for the admission was met for:   Discharge location: Yes - home with intermittent assistance of his girlfriend  Length of Stay: Yes - 14 days  Discharge activity level: Yes - mod i overall  Home/community participation: Yes  Services provided included: MD, RD, PT, OT, SLP, RN, TR, Pharmacy, Neuropsych and SW  Financial Services: Other: self - pay with Medicaid application started prior to d/c and referral made to [facility] for SSD app as OP  Follow-up services arranged: Home Health: PT, OT, ST via [facility], DME: NA - pt says he has all needed DME, Other: referred to [facility] medication assist program for pain meds only;  to follow up with [facility] for medical f/u and fill of other medications;  provided information on all crisis/ emergency assist programs available in [place] as well as food assistance programs. and Patient/Family has no preference for HH/DME agencies  Comments (or additional information):  Patient/Family verbalized understanding of follow-up arrangements: Yes  Individual responsible for coordination of the follow-up plan: pt  Confirmed correct DME delivered: pt reports that he already has access to recommended DME (w/c, walker and tub bench)    Jaskirat Schwieger
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
## 2014-12-14 ENCOUNTER — Ambulatory Visit: Payer: Self-pay | Admitting: Family Medicine

## 2014-12-20 ENCOUNTER — Telehealth: Payer: Self-pay | Admitting: *Deleted

## 2014-12-20 NOTE — Telephone Encounter (Signed)
He needs to utilize heat, ice for pain relief as well. May use oxycodone  oxy IR q6 hr prn.
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
## 2014-12-21 NOTE — Telephone Encounter (Signed)
I spoke with Mr Paul Galloway and he though his oxy was 5's and has been taking two at a time. They are 10s and he can take 1 1/2 q 6 but cannot take them round the clock.  I have explained if he needs more he will have to be seen and he will have to sign contract. I asked him to be sure he brings the bottle to his appointment when he comes.  If needed before 01/23/15 (and he probably will though they were filled 12/12/14 #120, he will need to be seen by Riley Lam.

## 2014-12-26 ENCOUNTER — Telehealth (HOSPITAL_COMMUNITY): Payer: Self-pay | Admitting: Physical Medicine and Rehabilitation

## 2014-12-27 ENCOUNTER — Other Ambulatory Visit: Payer: Self-pay | Admitting: Neurosurgery

## 2014-12-27 DIAGNOSIS — S12190D Other displaced fracture of second cervical vertebra, subsequent encounter for fracture with routine healing: Secondary | ICD-10-CM

## 2014-12-27 NOTE — Telephone Encounter (Signed)
Opened in error

## 2014-12-28 DIAGNOSIS — S129XXD Fracture of neck, unspecified, subsequent encounter: Secondary | ICD-10-CM

## 2014-12-28 DIAGNOSIS — S01511D Laceration without foreign body of lip, subsequent encounter: Secondary | ICD-10-CM

## 2014-12-28 DIAGNOSIS — S2220XD Unspecified fracture of sternum, subsequent encounter for fracture with routine healing: Secondary | ICD-10-CM

## 2014-12-28 DIAGNOSIS — S72001D Fracture of unspecified part of neck of right femur, subsequent encounter for closed fracture with routine healing: Secondary | ICD-10-CM

## 2015-01-01 ENCOUNTER — Ambulatory Visit: Payer: Self-pay | Admitting: Family Medicine

## 2015-01-03 ENCOUNTER — Telehealth: Payer: Self-pay | Admitting: *Deleted

## 2015-01-03 NOTE — Telephone Encounter (Signed)
Delma Freeze, speech therapist, [facility] called asking for verbal orders for home health visits to address swallow function.  I called back and gave verbal orders per office protocol

## 2015-01-07 ENCOUNTER — Ambulatory Visit
Admission: RE | Admit: 2015-01-07 | Discharge: 2015-01-07 | Disposition: A | Discharge status: Home / Self Care | Payer: No Typology Code available for payment source | Source: Ambulatory Visit | Attending: Neurosurgery | Admitting: Neurosurgery

## 2015-01-07 DIAGNOSIS — S12190D Other displaced fracture of second cervical vertebra, subsequent encounter for fracture with routine healing: Secondary | ICD-10-CM

## 2015-01-16 ENCOUNTER — Other Ambulatory Visit: Payer: Self-pay | Admitting: Physical Medicine and Rehabilitation

## 2015-01-17 ENCOUNTER — Telehealth: Payer: Self-pay | Admitting: *Deleted

## 2015-01-17 NOTE — Telephone Encounter (Signed)
Paul Galloway called and says that he is out of numerous medications and needs refills.  Before I could call him back I received a call from Paul Galloway [facility]  Requesting that HHPT be reinstated because his hip restrictions have been lifted.  She reported he was having a lot of depression and anxiety issues.  I gave the ok for hhPT to resume and let her know and Paul Galloway know that we will bring him in for 9:40 appt tomorrow with Dr Riley Kill arriving by 9:20.

## 2015-01-17 NOTE — Telephone Encounter (Signed)
Patient discharged from hospital with Rx for Klonopin 0.5 mg 3 times a day as needed for anxiety.  We rec'd an electronic request for a refill. Pt has an up coming hospital f/u on 01/23/2015.Marland KitchenMarland KitchenMarland KitchenMarland Kitchenplease advise

## 2015-01-18 ENCOUNTER — Encounter: Payer: Self-pay | Admitting: Physical Medicine & Rehabilitation

## 2015-01-18 ENCOUNTER — Encounter: Payer: Medicaid Other | Attending: Physical Medicine & Rehabilitation | Admitting: Physical Medicine & Rehabilitation

## 2015-01-18 VITALS — BP 144/82 | HR 62 | Resp 14

## 2015-01-18 DIAGNOSIS — S32401S Unspecified fracture of right acetabulum, sequela: Secondary | ICD-10-CM

## 2015-01-18 DIAGNOSIS — Z7901 Long term (current) use of anticoagulants: Secondary | ICD-10-CM | POA: Insufficient documentation

## 2015-01-18 DIAGNOSIS — M75102 Unspecified rotator cuff tear or rupture of left shoulder, not specified as traumatic: Secondary | ICD-10-CM

## 2015-01-18 DIAGNOSIS — X58XXXS Exposure to other specified factors, sequela: Secondary | ICD-10-CM | POA: Diagnosis not present

## 2015-01-18 DIAGNOSIS — M25512 Pain in left shoulder: Secondary | ICD-10-CM | POA: Insufficient documentation

## 2015-01-18 DIAGNOSIS — H61892 Other specified disorders of left external ear: Secondary | ICD-10-CM

## 2015-01-18 DIAGNOSIS — F419 Anxiety disorder, unspecified: Secondary | ICD-10-CM | POA: Diagnosis not present

## 2015-01-18 DIAGNOSIS — S12100A Unspecified displaced fracture of second cervical vertebra, initial encounter for closed fracture: Secondary | ICD-10-CM | POA: Insufficient documentation

## 2015-01-18 DIAGNOSIS — S12100S Unspecified displaced fracture of second cervical vertebra, sequela: Secondary | ICD-10-CM

## 2015-01-18 DIAGNOSIS — H9202 Otalgia, left ear: Secondary | ICD-10-CM

## 2015-01-18 DIAGNOSIS — I10 Essential (primary) hypertension: Secondary | ICD-10-CM | POA: Insufficient documentation

## 2015-01-18 DIAGNOSIS — F17210 Nicotine dependence, cigarettes, uncomplicated: Secondary | ICD-10-CM | POA: Insufficient documentation

## 2015-01-18 DIAGNOSIS — S12600A Unspecified displaced fracture of seventh cervical vertebra, initial encounter for closed fracture: Secondary | ICD-10-CM | POA: Insufficient documentation

## 2015-01-18 MED ORDER — OXYCODONE HCL 10 MG PO TABS: 5.0000 mg | ORAL_TABLET | Freq: Four times a day (QID) | ORAL | Status: AC | PRN

## 2015-01-18 MED ORDER — CLONAZEPAM 0.5 MG PO TABS: 0.5000 mg | ORAL_TABLET | Freq: Three times a day (TID) | ORAL | Status: AC | PRN

## 2015-01-18 MED ORDER — GABAPENTIN 300 MG PO CAPS: 300.0000 mg | ORAL_CAPSULE | Freq: Three times a day (TID) | ORAL | Status: AC

## 2015-01-18 NOTE — Patient Instructions (Signed)
GABAPENTIN   AT NIGHT FOR 4 DAYS, THEN  TWICE DAILY FOR 4 DAYS THEN  THREE X DAILY THEREAFTER
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
## 2015-01-23 ENCOUNTER — Encounter: Payer: Self-pay | Admitting: Physical Medicine & Rehabilitation

## 2015-01-28 ENCOUNTER — Telehealth: Payer: Self-pay | Admitting: Physical Medicine & Rehabilitation

## 2015-01-28 NOTE — Telephone Encounter (Signed)
Allen Derry with [facility] called to let us know that patient was being discharged from Home Health; BP was 131/98 patient not having any headaches or dizziness at this time.  Any questions please call her at [phone].

## 2015-01-29 ENCOUNTER — Other Ambulatory Visit: Payer: Self-pay | Admitting: Neurosurgery

## 2015-01-29 DIAGNOSIS — S12190A Other displaced fracture of second cervical vertebra, initial encounter for closed fracture: Secondary | ICD-10-CM

## 2015-01-29 NOTE — Telephone Encounter (Signed)
FYI

## 2015-02-15 ENCOUNTER — Encounter: Payer: Self-pay | Admitting: Registered Nurse

## 2015-02-18 ENCOUNTER — Ambulatory Visit
Admission: RE | Admit: 2015-02-18 | Discharge: 2015-02-18 | Disposition: A | Discharge status: Home / Self Care | Payer: No Typology Code available for payment source | Source: Ambulatory Visit | Attending: Neurosurgery | Admitting: Neurosurgery

## 2015-02-18 DIAGNOSIS — S12190A Other displaced fracture of second cervical vertebra, initial encounter for closed fracture: Secondary | ICD-10-CM

## 2015-02-21 ENCOUNTER — Encounter: Payer: Medicaid Other | Attending: Physical Medicine & Rehabilitation | Admitting: Registered Nurse

## 2015-02-21 ENCOUNTER — Other Ambulatory Visit: Payer: Self-pay | Admitting: Registered Nurse

## 2015-02-21 ENCOUNTER — Encounter: Payer: Self-pay | Admitting: Registered Nurse

## 2015-02-21 VITALS — BP 149/103 | HR 72

## 2015-02-21 DIAGNOSIS — S32401S Unspecified fracture of right acetabulum, sequela: Secondary | ICD-10-CM

## 2015-02-21 DIAGNOSIS — S12100S Unspecified displaced fracture of second cervical vertebra, sequela: Secondary | ICD-10-CM

## 2015-02-21 DIAGNOSIS — F419 Anxiety disorder, unspecified: Secondary | ICD-10-CM | POA: Insufficient documentation

## 2015-02-21 DIAGNOSIS — F17210 Nicotine dependence, cigarettes, uncomplicated: Secondary | ICD-10-CM | POA: Insufficient documentation

## 2015-02-21 DIAGNOSIS — S12600A Unspecified displaced fracture of seventh cervical vertebra, initial encounter for closed fracture: Secondary | ICD-10-CM | POA: Diagnosis not present

## 2015-02-21 DIAGNOSIS — S12100A Unspecified displaced fracture of second cervical vertebra, initial encounter for closed fracture: Secondary | ICD-10-CM | POA: Insufficient documentation

## 2015-02-21 DIAGNOSIS — Z7901 Long term (current) use of anticoagulants: Secondary | ICD-10-CM | POA: Insufficient documentation

## 2015-02-21 DIAGNOSIS — I10 Essential (primary) hypertension: Secondary | ICD-10-CM | POA: Diagnosis not present

## 2015-02-21 DIAGNOSIS — Z79899 Other long term (current) drug therapy: Secondary | ICD-10-CM

## 2015-02-21 DIAGNOSIS — M25512 Pain in left shoulder: Secondary | ICD-10-CM | POA: Insufficient documentation

## 2015-02-21 DIAGNOSIS — M75102 Unspecified rotator cuff tear or rupture of left shoulder, not specified as traumatic: Secondary | ICD-10-CM

## 2015-02-21 DIAGNOSIS — Z5181 Encounter for therapeutic drug level monitoring: Secondary | ICD-10-CM

## 2015-02-21 DIAGNOSIS — G894 Chronic pain syndrome: Secondary | ICD-10-CM
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
## 2015-02-22 ENCOUNTER — Other Ambulatory Visit: Payer: Self-pay | Admitting: Neurosurgery

## 2015-02-22 LAB — PMP ALCOHOL METABOLITE (ETG): Ethyl Glucuronide (EtG): NEGATIVE ng/mL

## 2015-02-26 ENCOUNTER — Telehealth: Payer: Self-pay | Admitting: Registered Nurse

## 2015-02-26 LAB — PRESCRIPTION MONITORING PROFILE (SOLSTAS)
AMPHETAMINE/METH: NEGATIVE ng/mL
BUPRENORPHINE, URINE: NEGATIVE ng/mL
Barbiturate Screen, Urine: NEGATIVE ng/mL
CARISOPRODOL, URINE: NEGATIVE ng/mL
CREATININE, URINE: 47.93 mg/dL (ref >20.0)
Cocaine Metabolites: NEGATIVE ng/mL
ECSTASY: NEGATIVE ng/mL
Fentanyl, Ur: NEGATIVE ng/mL
Meperidine, Ur: NEGATIVE ng/mL
Methadone Screen, Urine: NEGATIVE ng/mL
NITRITES URINE, INITIAL: NEGATIVE ug/mL
OXYCODONE SCRN UR: NEGATIVE ng/mL
Opiate Screen, Urine: NEGATIVE ng/mL
PH URINE, INITIAL: 5.6 pH (ref 4.5–8.9)
PROPOXYPHENE: NEGATIVE ng/mL
TRAMADOL UR: NEGATIVE ng/mL
Tapentadol, urine: NEGATIVE ng/mL
ZOLPIDEM, URINE: NEGATIVE ng/mL

## 2015-02-26 LAB — BENZODIAZEPINES (GC/LC/MS), URINE
ALPRAZOLAMU: NEGATIVE ng/mL (ref <25)
CLONAZEPAU: 151 ng/mL (ref <25)
FLURAZEPAMU: NEGATIVE ng/mL (ref <50)
LORAZEPAMU: NEGATIVE ng/mL (ref <50)
MIDAZOLAMU: NEGATIVE ng/mL (ref <50)
Nordiazepam (GC/LC/MS), ur confirm: NEGATIVE ng/mL (ref <50)
Oxazepam (GC/LC/MS), ur confirm: NEGATIVE ng/mL (ref <50)
TRIAZOLAMU: NEGATIVE ng/mL (ref <50)
Temazepam (GC/LC/MS), ur confirm: NEGATIVE ng/mL (ref <50)

## 2015-02-26 LAB — CANNABANOIDS (GC/LC/MS), URINE: THC-COOH (GC/LC/MS), ur confirm: 77 ng/mL — AB (ref <5)

## 2015-03-04 ENCOUNTER — Telehealth: Payer: Self-pay

## 2015-03-04 NOTE — Telephone Encounter (Signed)
Inconsistent urine drug screen.  Positive Marijuana.

## 2015-03-21 MED ORDER — VANCOMYCIN HCL IN DEXTROSE 1-5 GM/200ML-% IV SOLN: 1000.0000 mg | INTRAVENOUS | Status: DC

## 2015-03-21 DEATH — deceased

## 2015-03-22 ENCOUNTER — Inpatient Hospital Stay (HOSPITAL_COMMUNITY): Admission: RE | Admit: 2015-03-22 | Payer: Self-pay | Source: Ambulatory Visit | Admitting: Neurosurgery

## 2015-03-22 ENCOUNTER — Encounter (HOSPITAL_COMMUNITY): Admission: RE | Payer: Self-pay | Source: Ambulatory Visit

## 2015-03-22 SURGERY — POSTERIOR CERVICAL FUSION/FORAMINOTOMY LEVEL 3
Anesthesia: General

## 2015-04-29 ENCOUNTER — Ambulatory Visit: Payer: Self-pay | Admitting: Physical Medicine & Rehabilitation

## 2015-11-27 NOTE — Telephone Encounter (Signed)
Error
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
